# Patient Record
Sex: Male | Born: 1940 | Race: White | Hispanic: No | Marital: Married | Smoking: Never smoker
Health system: Southern US, Community
[De-identification: ages and names within clinical notes are randomized; demographics above are authoritative.]

## PROBLEM LIST (undated history)

## (undated) DIAGNOSIS — E119 Type 2 diabetes mellitus without complications: Secondary | ICD-10-CM

## (undated) DIAGNOSIS — I2510 Atherosclerotic heart disease of native coronary artery without angina pectoris: Secondary | ICD-10-CM

## (undated) DIAGNOSIS — M545 Low back pain, unspecified: Secondary | ICD-10-CM

## (undated) DIAGNOSIS — K5792 Diverticulitis of intestine, part unspecified, without perforation or abscess without bleeding: Secondary | ICD-10-CM

## (undated) DIAGNOSIS — Z860100 Personal history of colon polyps, unspecified: Secondary | ICD-10-CM

## (undated) DIAGNOSIS — I10 Essential (primary) hypertension: Secondary | ICD-10-CM

## (undated) DIAGNOSIS — G4733 Obstructive sleep apnea (adult) (pediatric): Secondary | ICD-10-CM

## (undated) DIAGNOSIS — N400 Enlarged prostate without lower urinary tract symptoms: Secondary | ICD-10-CM

## (undated) DIAGNOSIS — F411 Generalized anxiety disorder: Secondary | ICD-10-CM

## (undated) DIAGNOSIS — E785 Hyperlipidemia, unspecified: Secondary | ICD-10-CM

## (undated) DIAGNOSIS — D126 Benign neoplasm of colon, unspecified: Secondary | ICD-10-CM

## (undated) DIAGNOSIS — K219 Gastro-esophageal reflux disease without esophagitis: Secondary | ICD-10-CM

## (undated) DIAGNOSIS — R799 Abnormal finding of blood chemistry, unspecified: Secondary | ICD-10-CM

## (undated) DIAGNOSIS — I219 Acute myocardial infarction, unspecified: Secondary | ICD-10-CM

## (undated) DIAGNOSIS — N200 Calculus of kidney: Secondary | ICD-10-CM

## (undated) DIAGNOSIS — S9032XA Contusion of left foot, initial encounter: Secondary | ICD-10-CM

## (undated) DIAGNOSIS — S81809A Unspecified open wound, unspecified lower leg, initial encounter: Secondary | ICD-10-CM

## (undated) DIAGNOSIS — K649 Unspecified hemorrhoids: Secondary | ICD-10-CM

## (undated) DIAGNOSIS — IMO0002 Reserved for concepts with insufficient information to code with codable children: Secondary | ICD-10-CM

## (undated) DIAGNOSIS — H269 Unspecified cataract: Secondary | ICD-10-CM

## (undated) DIAGNOSIS — S91009A Unspecified open wound, unspecified ankle, initial encounter: Secondary | ICD-10-CM

## (undated) DIAGNOSIS — S81009A Unspecified open wound, unspecified knee, initial encounter: Secondary | ICD-10-CM

## (undated) DIAGNOSIS — K5730 Diverticulosis of large intestine without perforation or abscess without bleeding: Secondary | ICD-10-CM

## (undated) DIAGNOSIS — T79A29A Traumatic compartment syndrome of unspecified lower extremity, initial encounter: Secondary | ICD-10-CM

## (undated) DIAGNOSIS — Z9989 Dependence on other enabling machines and devices: Secondary | ICD-10-CM

## (undated) DIAGNOSIS — R55 Syncope and collapse: Secondary | ICD-10-CM

## (undated) DIAGNOSIS — E7800 Pure hypercholesterolemia, unspecified: Secondary | ICD-10-CM

## (undated) DIAGNOSIS — N189 Chronic kidney disease, unspecified: Secondary | ICD-10-CM

## (undated) DIAGNOSIS — R109 Unspecified abdominal pain: Secondary | ICD-10-CM

## (undated) DIAGNOSIS — N289 Disorder of kidney and ureter, unspecified: Secondary | ICD-10-CM

## (undated) DIAGNOSIS — N529 Male erectile dysfunction, unspecified: Secondary | ICD-10-CM

## (undated) DIAGNOSIS — K759 Inflammatory liver disease, unspecified: Secondary | ICD-10-CM

## (undated) DIAGNOSIS — I509 Heart failure, unspecified: Secondary | ICD-10-CM

## (undated) HISTORY — DX: Personal history of colonic polyps: Z86.010

## (undated) HISTORY — PX: LITHOTRIPSY: SUR834

## (undated) HISTORY — DX: Acute myocardial infarction, unspecified: I21.9

## (undated) HISTORY — DX: Inflammatory liver disease, unspecified: K75.9

## (undated) HISTORY — DX: Dependence on other enabling machines and devices: Z99.89

## (undated) HISTORY — DX: Essential (primary) hypertension: I10

## (undated) HISTORY — DX: Benign prostatic hyperplasia without lower urinary tract symptoms: N40.0

## (undated) HISTORY — PX: HERNIA REPAIR: SHX51

## (undated) HISTORY — DX: Unspecified abdominal pain: R10.9

## (undated) HISTORY — DX: Low back pain: M54.5

## (undated) HISTORY — DX: Atherosclerotic heart disease of native coronary artery without angina pectoris: I25.10

## (undated) HISTORY — DX: Generalized anxiety disorder: F41.1

## (undated) HISTORY — PX: CORONARY ARTERY BYPASS GRAFT: SHX141

## (undated) HISTORY — DX: Male erectile dysfunction, unspecified: N52.9

## (undated) HISTORY — DX: Unspecified open wound, unspecified knee, initial encounter: S81.009A

## (undated) HISTORY — DX: Unspecified hemorrhoids: K64.9

## (undated) HISTORY — DX: Unspecified open wound, unspecified ankle, initial encounter: S91.009A

## (undated) HISTORY — DX: Gastro-esophageal reflux disease without esophagitis: K21.9

## (undated) HISTORY — PX: ESOPHAGOGASTRODUODENOSCOPY: SHX1529

## (undated) HISTORY — DX: Type 2 diabetes mellitus without complications: E11.9

## (undated) HISTORY — DX: Calculus of kidney: N20.0

## (undated) HISTORY — DX: Heart failure, unspecified: I50.9

## (undated) HISTORY — PX: OTHER SURGICAL HISTORY: SHX169

## (undated) HISTORY — PX: CATARACT EXTRACTION W/ INTRAOCULAR LENS  IMPLANT, BILATERAL: SHX1307

## (undated) HISTORY — DX: Low back pain, unspecified: M54.50

## (undated) HISTORY — DX: Chronic kidney disease, unspecified: N18.9

## (undated) HISTORY — PX: APPENDECTOMY: SHX54

## (undated) HISTORY — DX: Traumatic compartment syndrome of unspecified lower extremity, initial encounter: T79.A29A

## (undated) HISTORY — PX: GALLBLADDER SURGERY: SHX652

## (undated) HISTORY — DX: Obstructive sleep apnea (adult) (pediatric): G47.33

## (undated) HISTORY — DX: Pure hypercholesterolemia, unspecified: E78.00

## (undated) HISTORY — DX: Abnormal finding of blood chemistry, unspecified: R79.9

## (undated) HISTORY — DX: Syncope and collapse: R55

## (undated) HISTORY — PX: CHOLECYSTECTOMY: SHX55

## (undated) HISTORY — DX: Disorder of kidney and ureter, unspecified: N28.9

## (undated) HISTORY — DX: Diverticulitis of intestine, part unspecified, without perforation or abscess without bleeding: K57.92

## (undated) HISTORY — PX: PTCA: SHX146

## (undated) HISTORY — DX: Contusion of left foot, initial encounter: S90.32XA

## (undated) HISTORY — DX: Unspecified cataract: H26.9

## (undated) HISTORY — DX: Unspecified open wound, unspecified lower leg, initial encounter: S81.809A

## (undated) HISTORY — PX: CORONARY ANGIOPLASTY WITH STENT PLACEMENT: SHX49

## (undated) HISTORY — DX: Personal history of colon polyps, unspecified: Z86.0100

## (undated) HISTORY — DX: Reserved for concepts with insufficient information to code with codable children: IMO0002

## (undated) HISTORY — DX: Diverticulosis of large intestine without perforation or abscess without bleeding: K57.30

## (undated) HISTORY — DX: Benign neoplasm of colon, unspecified: D12.6

## (undated) HISTORY — PX: ANKLE SURGERY: SHX546

## (undated) HISTORY — DX: Hyperlipidemia, unspecified: E78.5

---

## 1998-02-04 ENCOUNTER — Emergency Department (HOSPITAL_COMMUNITY): Admission: EM | Admit: 1998-02-04 | Discharge: 1998-02-04 | Payer: Self-pay | Admitting: Emergency Medicine

## 1998-08-04 ENCOUNTER — Inpatient Hospital Stay (HOSPITAL_COMMUNITY): Admission: AD | Admit: 1998-08-04 | Discharge: 1998-08-07 | Payer: Self-pay | Admitting: Cardiology

## 1998-08-04 ENCOUNTER — Encounter: Payer: Self-pay | Admitting: Cardiology

## 1998-08-05 ENCOUNTER — Encounter: Payer: Self-pay | Admitting: Cardiology

## 1999-08-10 ENCOUNTER — Inpatient Hospital Stay (HOSPITAL_COMMUNITY): Admission: RE | Admit: 1999-08-10 | Discharge: 1999-08-11 | Payer: Self-pay | Admitting: Surgery

## 1999-08-10 ENCOUNTER — Encounter: Payer: Self-pay | Admitting: Surgery

## 1999-08-10 ENCOUNTER — Encounter (INDEPENDENT_AMBULATORY_CARE_PROVIDER_SITE_OTHER): Payer: Self-pay | Admitting: *Deleted

## 1999-10-04 ENCOUNTER — Inpatient Hospital Stay (HOSPITAL_COMMUNITY): Admission: EM | Admit: 1999-10-04 | Discharge: 1999-10-09 | Payer: Self-pay | Admitting: Emergency Medicine

## 1999-10-04 ENCOUNTER — Encounter: Payer: Self-pay | Admitting: Cardiology

## 2000-09-19 ENCOUNTER — Encounter: Payer: Self-pay | Admitting: Cardiology

## 2000-09-19 ENCOUNTER — Inpatient Hospital Stay (HOSPITAL_COMMUNITY): Admission: RE | Admit: 2000-09-19 | Discharge: 2000-09-20 | Payer: Self-pay | Admitting: Cardiology

## 2001-02-21 ENCOUNTER — Inpatient Hospital Stay (HOSPITAL_COMMUNITY): Admission: AD | Admit: 2001-02-21 | Discharge: 2001-02-23 | Payer: Self-pay | Admitting: Cardiology

## 2001-02-21 ENCOUNTER — Encounter: Payer: Self-pay | Admitting: Cardiology

## 2001-05-17 ENCOUNTER — Encounter: Payer: Self-pay | Admitting: Gastroenterology

## 2001-05-17 ENCOUNTER — Ambulatory Visit (HOSPITAL_COMMUNITY): Admission: RE | Admit: 2001-05-17 | Discharge: 2001-05-17 | Payer: Self-pay | Admitting: Gastroenterology

## 2001-07-18 ENCOUNTER — Encounter: Payer: Self-pay | Admitting: Emergency Medicine

## 2001-07-18 ENCOUNTER — Inpatient Hospital Stay (HOSPITAL_COMMUNITY): Admission: EM | Admit: 2001-07-18 | Discharge: 2001-07-21 | Payer: Self-pay | Admitting: Emergency Medicine

## 2001-07-19 ENCOUNTER — Encounter: Payer: Self-pay | Admitting: Urology

## 2001-07-20 ENCOUNTER — Encounter: Payer: Self-pay | Admitting: Urology

## 2001-07-23 ENCOUNTER — Ambulatory Visit (HOSPITAL_COMMUNITY): Admission: RE | Admit: 2001-07-23 | Discharge: 2001-07-23 | Payer: Self-pay | Admitting: Urology

## 2001-08-02 ENCOUNTER — Encounter: Payer: Self-pay | Admitting: Urology

## 2001-08-02 ENCOUNTER — Ambulatory Visit (HOSPITAL_BASED_OUTPATIENT_CLINIC_OR_DEPARTMENT_OTHER): Admission: RE | Admit: 2001-08-02 | Discharge: 2001-08-02 | Payer: Self-pay | Admitting: Urology

## 2002-01-09 ENCOUNTER — Encounter: Payer: Self-pay | Admitting: Cardiology

## 2002-01-09 ENCOUNTER — Inpatient Hospital Stay (HOSPITAL_COMMUNITY): Admission: EM | Admit: 2002-01-09 | Discharge: 2002-01-12 | Payer: Self-pay | Admitting: Emergency Medicine

## 2002-05-05 ENCOUNTER — Encounter: Payer: Self-pay | Admitting: Emergency Medicine

## 2002-05-06 ENCOUNTER — Encounter: Payer: Self-pay | Admitting: Emergency Medicine

## 2002-05-06 ENCOUNTER — Inpatient Hospital Stay (HOSPITAL_COMMUNITY): Admission: EM | Admit: 2002-05-06 | Discharge: 2002-05-09 | Payer: Self-pay | Admitting: Emergency Medicine

## 2002-05-28 ENCOUNTER — Ambulatory Visit (HOSPITAL_COMMUNITY): Admission: RE | Admit: 2002-05-28 | Discharge: 2002-05-28 | Payer: Self-pay | Admitting: *Deleted

## 2002-06-17 ENCOUNTER — Encounter (INDEPENDENT_AMBULATORY_CARE_PROVIDER_SITE_OTHER): Payer: Self-pay | Admitting: Specialist

## 2002-06-17 ENCOUNTER — Encounter: Payer: Self-pay | Admitting: Surgery

## 2002-06-17 ENCOUNTER — Inpatient Hospital Stay (HOSPITAL_COMMUNITY): Admission: RE | Admit: 2002-06-17 | Discharge: 2002-06-23 | Payer: Self-pay | Admitting: Surgery

## 2002-06-28 ENCOUNTER — Encounter: Admission: RE | Admit: 2002-06-28 | Discharge: 2002-06-28 | Payer: Self-pay | Admitting: Surgery

## 2002-06-28 ENCOUNTER — Encounter: Payer: Self-pay | Admitting: Surgery

## 2002-10-16 ENCOUNTER — Ambulatory Visit (HOSPITAL_COMMUNITY): Admission: RE | Admit: 2002-10-16 | Discharge: 2002-10-16 | Payer: Self-pay | Admitting: *Deleted

## 2002-10-30 ENCOUNTER — Encounter: Payer: Self-pay | Admitting: Family Medicine

## 2002-10-30 ENCOUNTER — Encounter: Admission: RE | Admit: 2002-10-30 | Discharge: 2002-10-30 | Payer: Self-pay | Admitting: Family Medicine

## 2003-01-23 ENCOUNTER — Ambulatory Visit (HOSPITAL_COMMUNITY): Admission: RE | Admit: 2003-01-23 | Discharge: 2003-01-23 | Payer: Self-pay | Admitting: Surgery

## 2003-01-24 ENCOUNTER — Encounter: Payer: Self-pay | Admitting: Surgery

## 2003-01-24 ENCOUNTER — Ambulatory Visit (HOSPITAL_COMMUNITY): Admission: RE | Admit: 2003-01-24 | Discharge: 2003-01-24 | Payer: Self-pay | Admitting: Surgery

## 2003-03-02 ENCOUNTER — Encounter: Payer: Self-pay | Admitting: Emergency Medicine

## 2003-03-02 ENCOUNTER — Inpatient Hospital Stay (HOSPITAL_COMMUNITY): Admission: EM | Admit: 2003-03-02 | Discharge: 2003-03-04 | Payer: Self-pay | Admitting: Emergency Medicine

## 2003-04-22 ENCOUNTER — Emergency Department (HOSPITAL_COMMUNITY): Admission: EM | Admit: 2003-04-22 | Discharge: 2003-04-23 | Payer: Self-pay | Admitting: *Deleted

## 2003-04-22 ENCOUNTER — Encounter: Payer: Self-pay | Admitting: Cardiology

## 2003-07-26 HISTORY — PX: COLON SURGERY: SHX602

## 2003-09-09 ENCOUNTER — Inpatient Hospital Stay (HOSPITAL_COMMUNITY): Admission: EM | Admit: 2003-09-09 | Discharge: 2003-09-15 | Payer: Self-pay | Admitting: General Surgery

## 2003-10-10 ENCOUNTER — Inpatient Hospital Stay (HOSPITAL_COMMUNITY): Admission: RE | Admit: 2003-10-10 | Discharge: 2003-10-15 | Payer: Self-pay | Admitting: Surgery

## 2003-10-10 ENCOUNTER — Encounter (INDEPENDENT_AMBULATORY_CARE_PROVIDER_SITE_OTHER): Payer: Self-pay | Admitting: *Deleted

## 2004-05-10 ENCOUNTER — Encounter: Admission: RE | Admit: 2004-05-10 | Discharge: 2004-05-10 | Payer: Self-pay | Admitting: Internal Medicine

## 2004-07-25 HISTORY — PX: SMALL INTESTINE SURGERY: SHX150

## 2004-10-20 ENCOUNTER — Ambulatory Visit: Payer: Self-pay | Admitting: Internal Medicine

## 2005-01-26 ENCOUNTER — Encounter (INDEPENDENT_AMBULATORY_CARE_PROVIDER_SITE_OTHER): Payer: Self-pay | Admitting: Specialist

## 2005-01-26 ENCOUNTER — Inpatient Hospital Stay (HOSPITAL_COMMUNITY): Admission: RE | Admit: 2005-01-26 | Discharge: 2005-02-01 | Payer: Self-pay | Admitting: General Surgery

## 2005-02-17 ENCOUNTER — Ambulatory Visit: Payer: Self-pay | Admitting: Internal Medicine

## 2005-03-25 ENCOUNTER — Ambulatory Visit: Payer: Self-pay | Admitting: Internal Medicine

## 2005-05-20 ENCOUNTER — Ambulatory Visit: Payer: Self-pay | Admitting: Internal Medicine

## 2005-06-29 ENCOUNTER — Ambulatory Visit: Payer: Self-pay | Admitting: Internal Medicine

## 2005-07-04 ENCOUNTER — Ambulatory Visit: Payer: Self-pay | Admitting: Internal Medicine

## 2005-07-05 ENCOUNTER — Ambulatory Visit: Payer: Self-pay | Admitting: Internal Medicine

## 2005-07-05 ENCOUNTER — Encounter: Admission: RE | Admit: 2005-07-05 | Discharge: 2005-07-05 | Payer: Self-pay | Admitting: Internal Medicine

## 2005-07-22 ENCOUNTER — Ambulatory Visit: Payer: Self-pay | Admitting: Internal Medicine

## 2005-07-25 DIAGNOSIS — D126 Benign neoplasm of colon, unspecified: Secondary | ICD-10-CM

## 2005-07-25 HISTORY — DX: Benign neoplasm of colon, unspecified: D12.6

## 2005-08-04 ENCOUNTER — Ambulatory Visit: Payer: Self-pay | Admitting: Internal Medicine

## 2005-08-16 ENCOUNTER — Ambulatory Visit: Payer: Self-pay | Admitting: Gastroenterology

## 2005-08-24 ENCOUNTER — Ambulatory Visit: Payer: Self-pay | Admitting: Gastroenterology

## 2005-08-24 ENCOUNTER — Encounter (INDEPENDENT_AMBULATORY_CARE_PROVIDER_SITE_OTHER): Payer: Self-pay | Admitting: Specialist

## 2005-10-21 ENCOUNTER — Ambulatory Visit: Payer: Self-pay | Admitting: Internal Medicine

## 2005-10-21 ENCOUNTER — Inpatient Hospital Stay (HOSPITAL_COMMUNITY): Admission: AD | Admit: 2005-10-21 | Discharge: 2005-10-26 | Payer: Self-pay | Admitting: Internal Medicine

## 2005-10-21 ENCOUNTER — Ambulatory Visit: Payer: Self-pay | Admitting: *Deleted

## 2005-10-25 ENCOUNTER — Encounter: Payer: Self-pay | Admitting: Vascular Surgery

## 2005-10-27 ENCOUNTER — Ambulatory Visit: Payer: Self-pay | Admitting: Cardiology

## 2005-10-31 ENCOUNTER — Ambulatory Visit: Payer: Self-pay | Admitting: Internal Medicine

## 2005-11-09 IMAGING — CT CT PELVIS W/ CM
1 of 4 series · 14 of 32 positions shown, 19 images · IV contrast (omnipaque)
Comparison: 01/24/03.

CLINICAL DATA: Diverticulitis.  
 CT ABDOMEN AND PELVIS WITH CONTRAST ? 09/09/03
 Multidetector helical CT imaging was performed through the abdomen and pelvis following 150 cc of Omnipaque 300 IV.

[Series 2: abd/pelvis 5.0 b30f · axial · 0.84mm/px · z∈[-416,+8]mm · 14 of 97 slices shown, 19 images]
[im 6/97  soft-tissue]
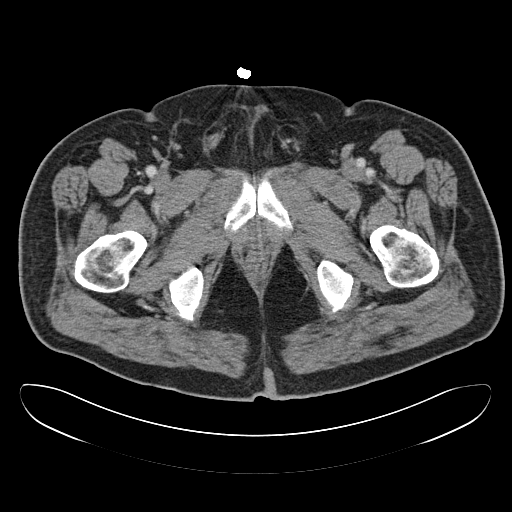
[im 6/97  bone]
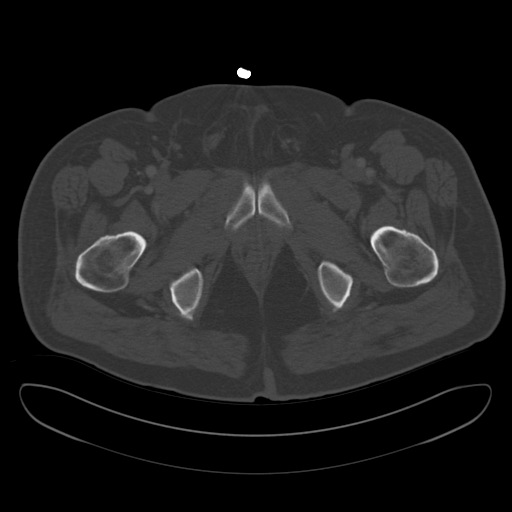
[im 11/97  soft-tissue]
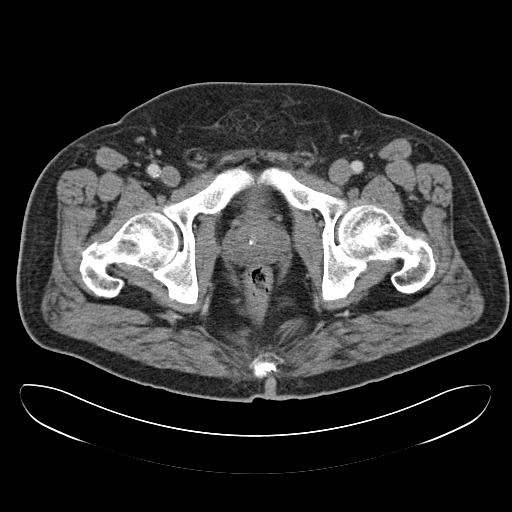
[im 22/97  soft-tissue]
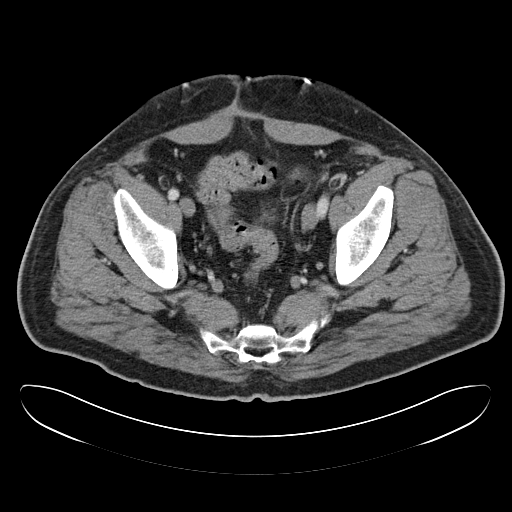
[im 27/97  soft-tissue]
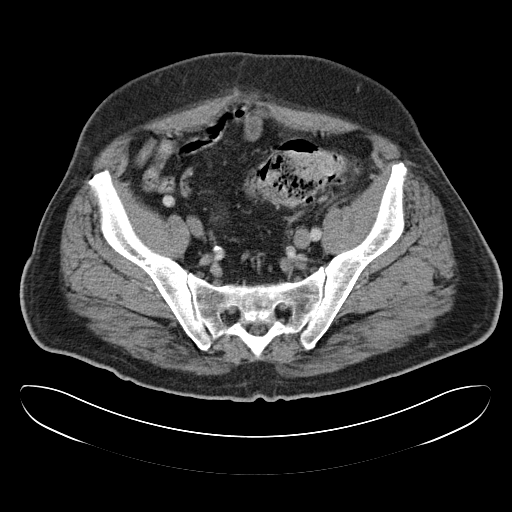
[im 33/97  soft-tissue]
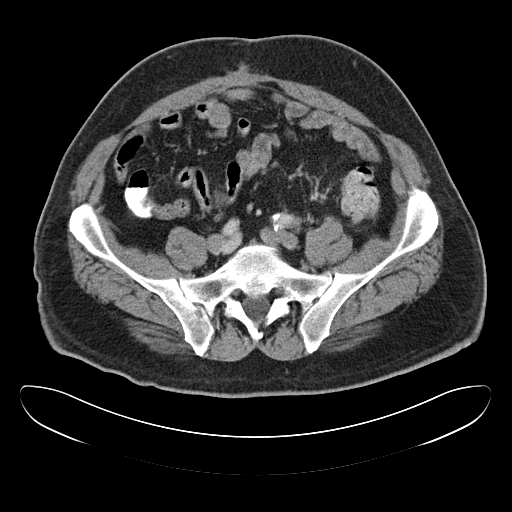
[im 43/97  soft-tissue]
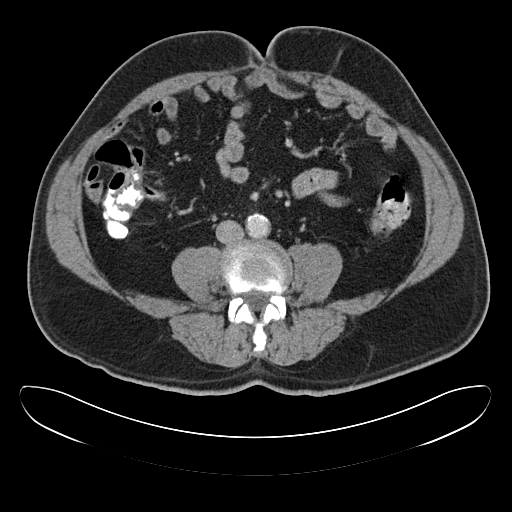
[im 49/97  soft-tissue]
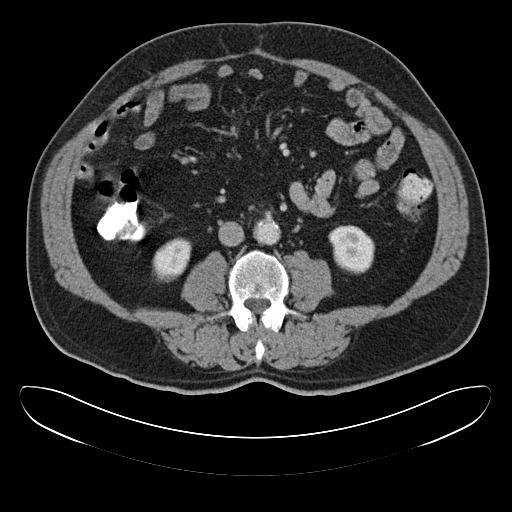
[im 54/97  soft-tissue]
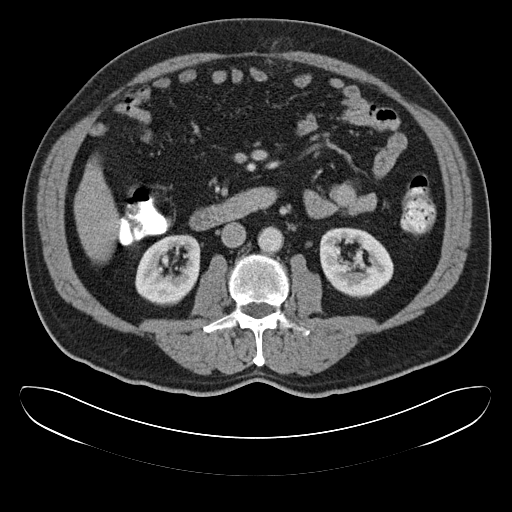
[im 65/97  soft-tissue]
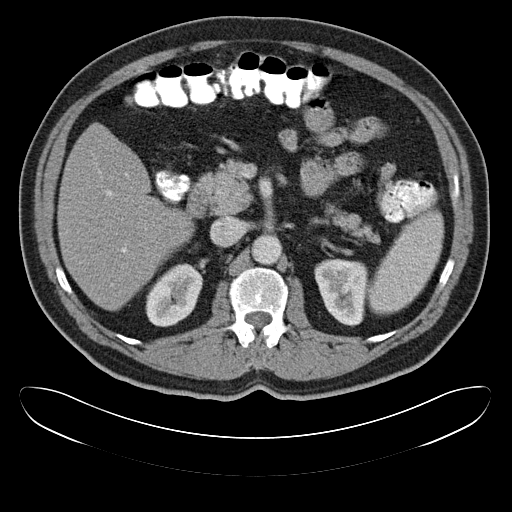
[im 65/97  bone]
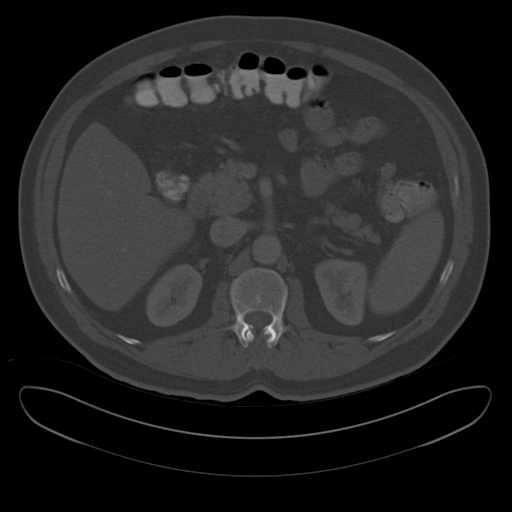
[im 70/97  soft-tissue]
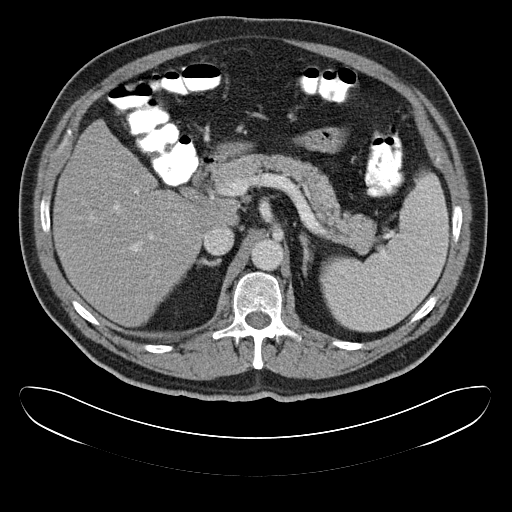
[im 75/97  soft-tissue]
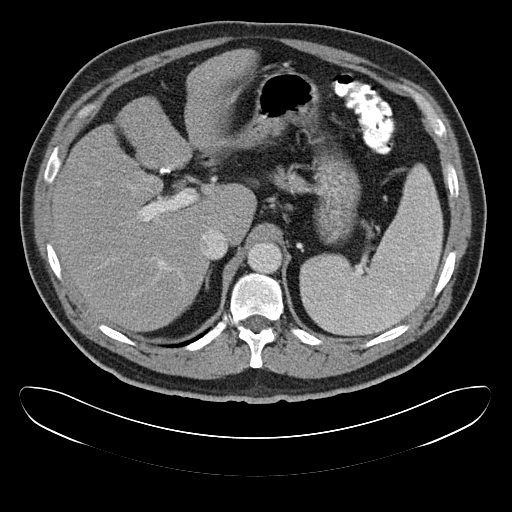
[im 75/97  lung]
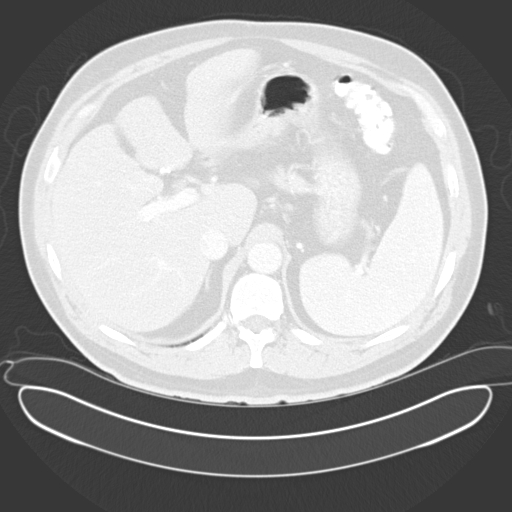
[im 81/97  lung]
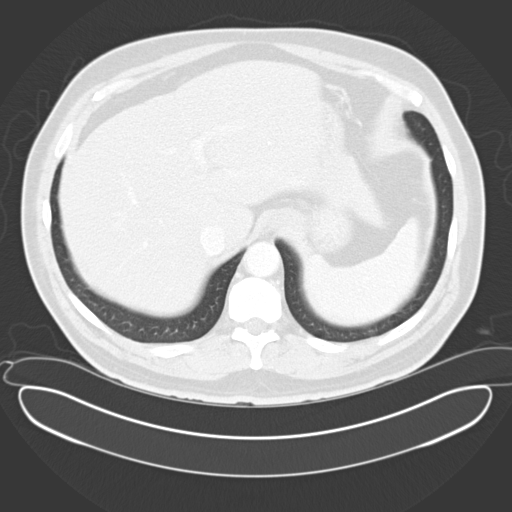
[im 86/97  soft-tissue]
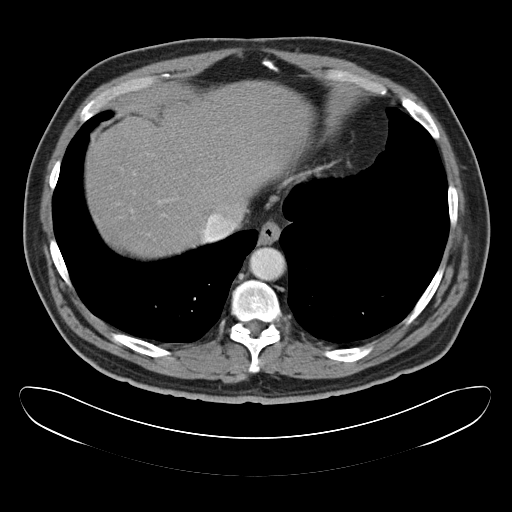
[im 86/97  lung]
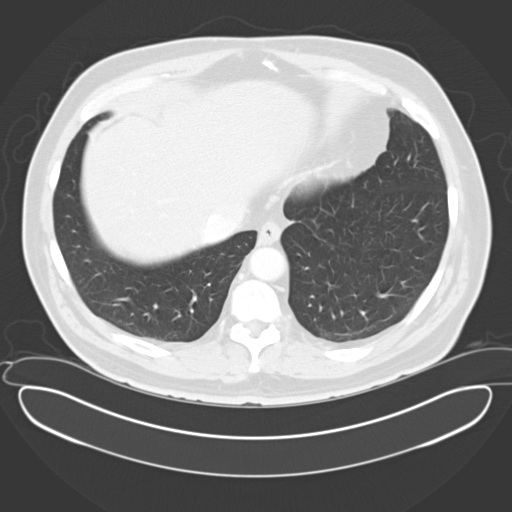
[im 91/97  soft-tissue]
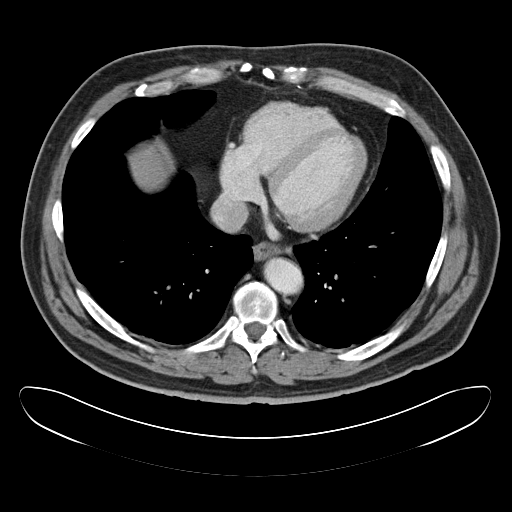
[im 91/97  lung]
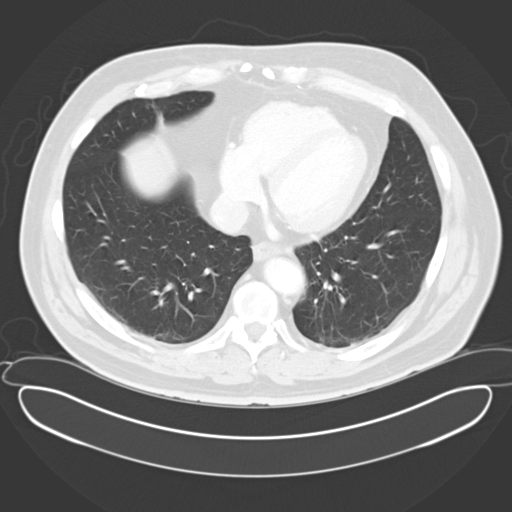

[14 of 32 positions shown; findings below may reference images not displayed]

CT ABDOMEN:
 The patient is status post cholecystectomy.  No focal lesions in the liver, spleen, pancreas, adrenals, or kidneys.  No free fluid, free air, or adenopathy.  The bowel is grossly unremarkable.  There are a few scattered diverticula within the descending colon.  No evidence of diverticulitis.
 IMPRESSION
 Status post cholecystectomy.  Diverticulosis.  No acute disease.
 CT PELVIS:
 Extensive diverticulosis is noted within the sigmoid colon and descending colon.  There is haziness noted around the sigmoid colon compatible with active diverticulitis.  This is new since the prior study.  No free fluid, free air, or adenopathy.  
 IMPRESSION
 Diverticulosis with mild sigmoid diverticulitis.

## 2005-11-11 ENCOUNTER — Ambulatory Visit: Payer: Self-pay | Admitting: Cardiology

## 2005-11-15 ENCOUNTER — Ambulatory Visit: Payer: Self-pay | Admitting: Cardiology

## 2006-02-08 ENCOUNTER — Ambulatory Visit: Payer: Self-pay | Admitting: Internal Medicine

## 2006-04-03 ENCOUNTER — Ambulatory Visit: Payer: Self-pay | Admitting: Internal Medicine

## 2006-04-13 ENCOUNTER — Ambulatory Visit: Payer: Self-pay | Admitting: Internal Medicine

## 2006-04-26 ENCOUNTER — Ambulatory Visit: Payer: Self-pay | Admitting: Internal Medicine

## 2006-05-04 ENCOUNTER — Ambulatory Visit: Payer: Self-pay | Admitting: Gastroenterology

## 2006-05-19 ENCOUNTER — Ambulatory Visit: Payer: Self-pay | Admitting: Internal Medicine

## 2006-05-19 LAB — CONVERTED CEMR LAB
ALT: 20 units/L (ref 0–40)
AST: 24 units/L (ref 0–37)
LDL Cholesterol: 101 mg/dL — ABNORMAL HIGH (ref 0–99)
VLDL: 18 mg/dL (ref 0–40)

## 2006-05-29 ENCOUNTER — Ambulatory Visit: Payer: Self-pay | Admitting: Internal Medicine

## 2006-08-30 DIAGNOSIS — D126 Benign neoplasm of colon, unspecified: Secondary | ICD-10-CM

## 2006-08-30 DIAGNOSIS — K589 Irritable bowel syndrome without diarrhea: Secondary | ICD-10-CM | POA: Insufficient documentation

## 2006-08-30 DIAGNOSIS — K5730 Diverticulosis of large intestine without perforation or abscess without bleeding: Secondary | ICD-10-CM | POA: Insufficient documentation

## 2007-03-21 ENCOUNTER — Ambulatory Visit: Payer: Self-pay | Admitting: Internal Medicine

## 2007-03-26 LAB — CONVERTED CEMR LAB
Hgb A1c MFr Bld: 5.6 % (ref 4.6–6.0)
Microalb Creat Ratio: 13.9 mg/g (ref 0.0–30.0)

## 2007-03-27 ENCOUNTER — Encounter (INDEPENDENT_AMBULATORY_CARE_PROVIDER_SITE_OTHER): Payer: Self-pay | Admitting: *Deleted

## 2007-04-02 ENCOUNTER — Ambulatory Visit: Payer: Self-pay | Admitting: Internal Medicine

## 2007-04-02 DIAGNOSIS — M545 Low back pain: Secondary | ICD-10-CM

## 2007-04-02 DIAGNOSIS — R109 Unspecified abdominal pain: Secondary | ICD-10-CM | POA: Insufficient documentation

## 2007-04-02 DIAGNOSIS — R7989 Other specified abnormal findings of blood chemistry: Secondary | ICD-10-CM | POA: Insufficient documentation

## 2007-04-02 DIAGNOSIS — R1013 Epigastric pain: Secondary | ICD-10-CM

## 2007-04-10 ENCOUNTER — Ambulatory Visit: Payer: Self-pay | Admitting: Internal Medicine

## 2007-04-11 ENCOUNTER — Ambulatory Visit: Payer: Self-pay | Admitting: Internal Medicine

## 2007-04-13 ENCOUNTER — Encounter (INDEPENDENT_AMBULATORY_CARE_PROVIDER_SITE_OTHER): Payer: Self-pay | Admitting: *Deleted

## 2007-04-13 ENCOUNTER — Ambulatory Visit: Payer: Self-pay | Admitting: Internal Medicine

## 2007-04-13 LAB — CONVERTED CEMR LAB
ALT: 20 units/L (ref 0–53)
AST: 21 units/L (ref 0–37)
Albumin: 4 g/dL (ref 3.5–5.2)
Amylase: 44 units/L (ref 27–131)
Basophils Relative: 0.2 % (ref 0.0–1.0)
HCT: 38.5 % — ABNORMAL LOW (ref 39.0–52.0)
Hemoglobin: 13.7 g/dL (ref 13.0–17.0)
Monocytes Absolute: 0.6 10*3/uL (ref 0.2–0.7)
Monocytes Relative: 7.8 % (ref 3.0–11.0)
Neutrophils Relative %: 64.6 % (ref 43.0–77.0)
RDW: 13 % (ref 11.5–14.6)
Total Bilirubin: 0.9 mg/dL (ref 0.3–1.2)

## 2007-05-30 ENCOUNTER — Inpatient Hospital Stay (HOSPITAL_COMMUNITY): Admission: EM | Admit: 2007-05-30 | Discharge: 2007-06-01 | Payer: Self-pay | Admitting: Emergency Medicine

## 2007-06-13 ENCOUNTER — Ambulatory Visit: Payer: Self-pay | Admitting: Internal Medicine

## 2007-07-10 ENCOUNTER — Encounter: Payer: Self-pay | Admitting: Internal Medicine

## 2007-07-25 ENCOUNTER — Telehealth (INDEPENDENT_AMBULATORY_CARE_PROVIDER_SITE_OTHER): Payer: Self-pay | Admitting: *Deleted

## 2007-07-27 ENCOUNTER — Telehealth (INDEPENDENT_AMBULATORY_CARE_PROVIDER_SITE_OTHER): Payer: Self-pay | Admitting: *Deleted

## 2007-07-27 ENCOUNTER — Encounter: Payer: Self-pay | Admitting: Internal Medicine

## 2007-07-30 ENCOUNTER — Ambulatory Visit: Payer: Self-pay | Admitting: Internal Medicine

## 2007-07-30 DIAGNOSIS — T887XXA Unspecified adverse effect of drug or medicament, initial encounter: Secondary | ICD-10-CM | POA: Insufficient documentation

## 2007-07-30 LAB — CONVERTED CEMR LAB
ALT: 27 units/L (ref 0–53)
AST: 29 units/L (ref 0–37)
BUN: 27 mg/dL — ABNORMAL HIGH (ref 6–23)
Microalb Creat Ratio: 8.2 mg/g (ref 0.0–30.0)

## 2007-08-02 ENCOUNTER — Ambulatory Visit: Payer: Self-pay | Admitting: Internal Medicine

## 2007-08-02 DIAGNOSIS — E782 Mixed hyperlipidemia: Secondary | ICD-10-CM

## 2007-08-02 DIAGNOSIS — N189 Chronic kidney disease, unspecified: Secondary | ICD-10-CM

## 2007-08-02 DIAGNOSIS — I2510 Atherosclerotic heart disease of native coronary artery without angina pectoris: Secondary | ICD-10-CM

## 2007-08-02 DIAGNOSIS — R55 Syncope and collapse: Secondary | ICD-10-CM

## 2007-08-02 DIAGNOSIS — I10 Essential (primary) hypertension: Secondary | ICD-10-CM

## 2007-08-02 LAB — CONVERTED CEMR LAB: Cholesterol, target level: 200 mg/dL

## 2007-08-03 ENCOUNTER — Telehealth (INDEPENDENT_AMBULATORY_CARE_PROVIDER_SITE_OTHER): Payer: Self-pay | Admitting: *Deleted

## 2007-08-07 ENCOUNTER — Encounter: Payer: Self-pay | Admitting: Internal Medicine

## 2007-10-31 ENCOUNTER — Telehealth (INDEPENDENT_AMBULATORY_CARE_PROVIDER_SITE_OTHER): Payer: Self-pay | Admitting: *Deleted

## 2007-11-01 ENCOUNTER — Ambulatory Visit: Payer: Self-pay | Admitting: Internal Medicine

## 2007-11-01 DIAGNOSIS — S139XXA Sprain of joints and ligaments of unspecified parts of neck, initial encounter: Secondary | ICD-10-CM

## 2007-11-01 DIAGNOSIS — S9030XA Contusion of unspecified foot, initial encounter: Secondary | ICD-10-CM | POA: Insufficient documentation

## 2007-12-03 ENCOUNTER — Ambulatory Visit: Payer: Self-pay | Admitting: Internal Medicine

## 2007-12-03 LAB — CONVERTED CEMR LAB
AST: 20 units/L (ref 0–37)
BUN: 37 mg/dL — ABNORMAL HIGH (ref 6–23)
Creatinine, Ser: 1.6 mg/dL — ABNORMAL HIGH (ref 0.4–1.5)
Direct LDL: 205.6 mg/dL
HDL: 29.4 mg/dL — ABNORMAL LOW (ref >39.0)
Hgb A1c MFr Bld: 5.8 % (ref 4.6–6.0)
Potassium: 4 meq/L (ref 3.5–5.1)
Total Bilirubin: 0.9 mg/dL (ref 0.3–1.2)
Total CHOL/HDL Ratio: 8.7
Triglycerides: 166 mg/dL — ABNORMAL HIGH (ref 0–149)
VLDL: 33 mg/dL (ref 0–40)

## 2007-12-07 ENCOUNTER — Ambulatory Visit: Payer: Self-pay | Admitting: Internal Medicine

## 2007-12-07 DIAGNOSIS — E785 Hyperlipidemia, unspecified: Secondary | ICD-10-CM

## 2007-12-07 DIAGNOSIS — M542 Cervicalgia: Secondary | ICD-10-CM | POA: Insufficient documentation

## 2008-01-14 ENCOUNTER — Ambulatory Visit: Payer: Self-pay | Admitting: Internal Medicine

## 2008-01-14 DIAGNOSIS — M79609 Pain in unspecified limb: Secondary | ICD-10-CM | POA: Insufficient documentation

## 2008-01-14 DIAGNOSIS — D179 Benign lipomatous neoplasm, unspecified: Secondary | ICD-10-CM | POA: Insufficient documentation

## 2008-01-24 ENCOUNTER — Encounter: Payer: Self-pay | Admitting: Internal Medicine

## 2008-01-28 ENCOUNTER — Encounter: Payer: Self-pay | Admitting: Internal Medicine

## 2008-02-13 ENCOUNTER — Encounter (INDEPENDENT_AMBULATORY_CARE_PROVIDER_SITE_OTHER): Payer: Self-pay | Admitting: *Deleted

## 2008-03-26 ENCOUNTER — Ambulatory Visit: Payer: Self-pay | Admitting: Internal Medicine

## 2008-03-26 ENCOUNTER — Telehealth (INDEPENDENT_AMBULATORY_CARE_PROVIDER_SITE_OTHER): Payer: Self-pay | Admitting: *Deleted

## 2008-03-26 DIAGNOSIS — R7309 Other abnormal glucose: Secondary | ICD-10-CM

## 2008-03-26 DIAGNOSIS — R51 Headache: Secondary | ICD-10-CM

## 2008-03-26 DIAGNOSIS — R519 Headache, unspecified: Secondary | ICD-10-CM | POA: Insufficient documentation

## 2008-05-05 ENCOUNTER — Telehealth (INDEPENDENT_AMBULATORY_CARE_PROVIDER_SITE_OTHER): Payer: Self-pay | Admitting: *Deleted

## 2008-06-11 ENCOUNTER — Ambulatory Visit: Payer: Self-pay | Admitting: Internal Medicine

## 2008-07-09 ENCOUNTER — Ambulatory Visit: Payer: Self-pay | Admitting: Internal Medicine

## 2008-07-28 ENCOUNTER — Ambulatory Visit: Payer: Self-pay | Admitting: Internal Medicine

## 2008-08-03 LAB — CONVERTED CEMR LAB: Hgb A1c MFr Bld: 6 % (ref 4.6–6.0)

## 2008-08-04 ENCOUNTER — Encounter (INDEPENDENT_AMBULATORY_CARE_PROVIDER_SITE_OTHER): Payer: Self-pay | Admitting: *Deleted

## 2008-09-01 ENCOUNTER — Encounter: Payer: Self-pay | Admitting: Internal Medicine

## 2008-10-30 ENCOUNTER — Ambulatory Visit: Payer: Self-pay | Admitting: Internal Medicine

## 2008-10-30 DIAGNOSIS — R109 Unspecified abdominal pain: Secondary | ICD-10-CM

## 2008-10-30 DIAGNOSIS — Z87442 Personal history of urinary calculi: Secondary | ICD-10-CM | POA: Insufficient documentation

## 2008-10-30 LAB — CONVERTED CEMR LAB
Bilirubin Urine: NEGATIVE
Blood in Urine, dipstick: NEGATIVE
Glucose, Urine, Semiquant: NEGATIVE
Protein, U semiquant: NEGATIVE
Specific Gravity, Urine: 1.015
pH: 6

## 2008-11-03 ENCOUNTER — Telehealth: Payer: Self-pay | Admitting: Internal Medicine

## 2009-01-09 ENCOUNTER — Encounter: Payer: Self-pay | Admitting: Internal Medicine

## 2009-01-28 ENCOUNTER — Ambulatory Visit: Payer: Self-pay | Admitting: Internal Medicine

## 2009-01-31 LAB — CONVERTED CEMR LAB: Hgb A1c MFr Bld: 6.1 % (ref 4.6–6.5)

## 2009-02-03 ENCOUNTER — Encounter (INDEPENDENT_AMBULATORY_CARE_PROVIDER_SITE_OTHER): Payer: Self-pay | Admitting: *Deleted

## 2009-03-17 ENCOUNTER — Encounter: Payer: Self-pay | Admitting: Internal Medicine

## 2009-03-19 ENCOUNTER — Ambulatory Visit: Payer: Self-pay | Admitting: Internal Medicine

## 2009-05-06 ENCOUNTER — Ambulatory Visit: Payer: Self-pay | Admitting: Internal Medicine

## 2009-05-13 ENCOUNTER — Encounter (INDEPENDENT_AMBULATORY_CARE_PROVIDER_SITE_OTHER): Payer: Self-pay | Admitting: *Deleted

## 2009-05-26 ENCOUNTER — Ambulatory Visit: Payer: Self-pay | Admitting: Internal Medicine

## 2009-06-01 ENCOUNTER — Encounter (INDEPENDENT_AMBULATORY_CARE_PROVIDER_SITE_OTHER): Payer: Self-pay | Admitting: *Deleted

## 2009-06-01 LAB — CONVERTED CEMR LAB
ALT: 24 units/L (ref 0–53)
AST: 25 units/L (ref 0–37)
Albumin: 4.4 g/dL (ref 3.5–5.2)
Alkaline Phosphatase: 42 units/L (ref 39–117)
BUN: 40 mg/dL — ABNORMAL HIGH (ref 6–23)
Cholesterol: 180 mg/dL (ref 0–200)
Direct LDL: 127.3 mg/dL
Hgb A1c MFr Bld: 5.9 % (ref 4.6–6.5)
Microalb Creat Ratio: 4.6 mg/g (ref 0.0–30.0)
Microalb, Ur: 1.5 mg/dL (ref 0.0–1.9)
Total Bilirubin: 1 mg/dL (ref 0.3–1.2)
Triglycerides: 220 mg/dL — ABNORMAL HIGH (ref 0.0–149.0)

## 2009-06-05 ENCOUNTER — Ambulatory Visit: Payer: Self-pay | Admitting: Internal Medicine

## 2009-06-05 DIAGNOSIS — N259 Disorder resulting from impaired renal tubular function, unspecified: Secondary | ICD-10-CM | POA: Insufficient documentation

## 2009-07-07 ENCOUNTER — Ambulatory Visit: Payer: Self-pay | Admitting: Internal Medicine

## 2009-07-13 LAB — CONVERTED CEMR LAB
Creatinine, Ser: 1.8 mg/dL — ABNORMAL HIGH (ref 0.4–1.5)
Uric Acid, Serum: 5.6 mg/dL (ref 4.0–7.8)

## 2009-07-14 ENCOUNTER — Ambulatory Visit: Payer: Self-pay | Admitting: Internal Medicine

## 2009-07-14 DIAGNOSIS — R1319 Other dysphagia: Secondary | ICD-10-CM | POA: Insufficient documentation

## 2009-07-14 DIAGNOSIS — R079 Chest pain, unspecified: Secondary | ICD-10-CM | POA: Insufficient documentation

## 2009-10-23 ENCOUNTER — Encounter: Payer: Self-pay | Admitting: Internal Medicine

## 2009-11-04 ENCOUNTER — Encounter: Payer: Self-pay | Admitting: Internal Medicine

## 2009-11-18 ENCOUNTER — Ambulatory Visit: Payer: Self-pay | Admitting: Internal Medicine

## 2009-11-18 DIAGNOSIS — M25519 Pain in unspecified shoulder: Secondary | ICD-10-CM | POA: Insufficient documentation

## 2009-11-18 DIAGNOSIS — IMO0001 Reserved for inherently not codable concepts without codable children: Secondary | ICD-10-CM

## 2009-11-18 DIAGNOSIS — F411 Generalized anxiety disorder: Secondary | ICD-10-CM | POA: Insufficient documentation

## 2009-11-19 ENCOUNTER — Telehealth (INDEPENDENT_AMBULATORY_CARE_PROVIDER_SITE_OTHER): Payer: Self-pay | Admitting: *Deleted

## 2009-11-19 LAB — CONVERTED CEMR LAB
Bilirubin, Direct: 0 mg/dL (ref 0.0–0.3)
Calcium: 9.2 mg/dL (ref 8.4–10.5)
Creatinine, Ser: 2 mg/dL — ABNORMAL HIGH (ref 0.4–1.5)
GFR calc non Af Amer: 35.56 mL/min (ref >60)
Glucose, Bld: 140 mg/dL — ABNORMAL HIGH (ref 70–99)
HDL: 40 mg/dL (ref >39.00)
Phosphorus: 4.1 mg/dL (ref 2.3–4.6)
Potassium: 4.3 meq/L (ref 3.5–5.1)
Sodium: 143 meq/L (ref 135–145)
Total Bilirubin: 0.8 mg/dL (ref 0.3–1.2)
Total CHOL/HDL Ratio: 5
VLDL: 32.2 mg/dL (ref 0.0–40.0)

## 2009-11-27 ENCOUNTER — Telehealth (INDEPENDENT_AMBULATORY_CARE_PROVIDER_SITE_OTHER): Payer: Self-pay | Admitting: *Deleted

## 2009-11-30 ENCOUNTER — Ambulatory Visit: Payer: Self-pay | Admitting: Internal Medicine

## 2009-12-30 ENCOUNTER — Encounter: Payer: Self-pay | Admitting: Internal Medicine

## 2010-01-26 ENCOUNTER — Telehealth (INDEPENDENT_AMBULATORY_CARE_PROVIDER_SITE_OTHER): Payer: Self-pay | Admitting: *Deleted

## 2010-02-03 ENCOUNTER — Ambulatory Visit: Payer: Self-pay | Admitting: Internal Medicine

## 2010-02-11 LAB — CONVERTED CEMR LAB
Alkaline Phosphatase: 47 units/L (ref 39–117)
BUN: 28 mg/dL — ABNORMAL HIGH (ref 6–23)
Bilirubin, Direct: 0.1 mg/dL (ref 0.0–0.3)
Cholesterol: 181 mg/dL (ref 0–200)
Creatinine, Ser: 1.5 mg/dL (ref 0.4–1.5)
HDL: 40.5 mg/dL (ref >39.00)
LDL Cholesterol: 107 mg/dL — ABNORMAL HIGH (ref 0–99)
Potassium: 4.2 meq/L (ref 3.5–5.1)
Total Bilirubin: 0.7 mg/dL (ref 0.3–1.2)
Total CHOL/HDL Ratio: 4
Total Protein: 7.4 g/dL (ref 6.0–8.3)
Triglycerides: 170 mg/dL — ABNORMAL HIGH (ref 0.0–149.0)
VLDL: 34 mg/dL (ref 0.0–40.0)

## 2010-02-16 ENCOUNTER — Ambulatory Visit: Payer: Self-pay | Admitting: Internal Medicine

## 2010-03-30 ENCOUNTER — Encounter: Payer: Self-pay | Admitting: Internal Medicine

## 2010-03-31 ENCOUNTER — Encounter: Payer: Self-pay | Admitting: Internal Medicine

## 2010-04-22 ENCOUNTER — Ambulatory Visit: Payer: Self-pay | Admitting: Internal Medicine

## 2010-04-22 DIAGNOSIS — M940 Chondrocostal junction syndrome [Tietze]: Secondary | ICD-10-CM

## 2010-05-18 ENCOUNTER — Encounter: Payer: Self-pay | Admitting: Internal Medicine

## 2010-07-01 ENCOUNTER — Ambulatory Visit: Payer: Self-pay | Admitting: Internal Medicine

## 2010-07-25 HISTORY — PX: INCISIONAL HERNIA REPAIR: SHX193

## 2010-07-29 ENCOUNTER — Telehealth: Payer: Self-pay | Admitting: Internal Medicine

## 2010-08-04 ENCOUNTER — Encounter: Payer: Self-pay | Admitting: Gastroenterology

## 2010-08-16 ENCOUNTER — Ambulatory Visit
Admission: RE | Admit: 2010-08-16 | Discharge: 2010-08-16 | Payer: Self-pay | Source: Home / Self Care | Attending: Internal Medicine | Admitting: Internal Medicine

## 2010-08-16 DIAGNOSIS — M7580 Other shoulder lesions, unspecified shoulder: Secondary | ICD-10-CM

## 2010-08-17 ENCOUNTER — Ambulatory Visit
Admission: RE | Admit: 2010-08-17 | Discharge: 2010-08-17 | Payer: Self-pay | Source: Home / Self Care | Attending: Internal Medicine | Admitting: Internal Medicine

## 2010-08-24 NOTE — Assessment & Plan Note (Signed)
Summary: POSSIBLE FLU/KB   Vital Signs:  Patient profile:   70 year old male Weight:      251.8 pounds BMI:     36.78 O2 Sat:      95 % on Room air Temp:     98.4 degrees F oral Pulse rate:   64 / minute Resp:     15 per minute BP sitting:   110 / 72  (left arm) Cuff size:   large  Vitals Entered By: Shonna Chock CMA (July 01, 2010 10:17 AM)  O2 Flow:  Room air CC: Cough, nausea, sweats and achy x 1 week-? Flu , URI symptoms   CC:  Cough, nausea, sweats and achy x 1 week-? Flu , and URI symptoms.  History of Present Illness:      This is a 70 year old man who presents with RTI symptoms; onset as ST & dry cough.  The patient  now reports clear nasal discharge, sore throat, and productive cough with scant yellow, but denies earache.  The patient denies fever ( but some sweating), dyspnea, and wheezing.  The patient denies headache.  The patient denies the following risk factors for Strep sinusitis: unilateral facial pain, tooth pain, and tender adenopathy.  Rx: Robitussin , Tylenol, Ricola. He had Flu shot.  Current Medications (verified): 1)  Imdur 30 Mg Xr24h-Tab (Isosorbide Mononitrate) .Marland Kitchen.. 1 By Mouth Two Times A Day 2)  Paroxetine Hcl 30 Mg  Tabs (Paroxetine Hcl) .Marland Kitchen.. 1 Two Times A Day 3)  Losartan Potassium 100 Mg Tabs (Losartan Potassium) .Marland Kitchen.. 1 By Mouth Once Daily 4)  Fiber .... 2 Tabs Daily 5)  Vit C 1000mg  6)  Aspirin Adult Low Strength 81 Mg Tbec (Aspirin) .Marland Kitchen.. 1 By Mouth Once Daily 7)  Nitro-Dur 0.4 Mg/hr Pt24 (Nitroglycerin) .... As Needed 8)  Carvedilol 25 Mg Tabs (Carvedilol) .Marland Kitchen.. 1 By Mouth Two Times A Day 9)  Effient 10 Mg Tabs (Prasugrel Hcl) .Marland Kitchen.. 1 By Mouth Once Daily 10)  Prilosec Otc 20 Mg Tbec (Omeprazole Magnesium) .Marland Kitchen.. 1 By Mouth  Two Times A Day Pre Meals 11)  Ranexa 500 Mg Xr12h-Tab (Ranolazine) .Marland Kitchen.. 1 By Mouth Two Times A Day 12)  Trilipix 135 Mg Cpdr (Choline Fenofibrate) .Marland Kitchen.. 1 Each Am 13)  Onetouch Ultra Test  Strp (Glucose Blood) .... Onetouch  Ultra2 Test Strips Dx: 790.29, Check Bloodsugar Once Daily 14)  Crestor 40 Mg Tabs (Rosuvastatin Calcium) .Marland Kitchen.. 1 By Mouth At Bedtime  Allergies: 1)  ! Lipitor (Atorvastatin) 2)  ! Zocor 3)  ! Crestor (Rosuvastatin Calcium)  Physical Exam  General:  Appears tired but in no acute distress; alert,appropriate and cooperative throughout examination Ears:  External ear exam shows no significant lesions or deformities.  Otoscopic examination reveals clear canals, tympanic membranes are intact bilaterally without bulging, retraction, inflammation or discharge. Hearing is grossly normal bilaterally. Nose:  External nasal examination shows no deformity or inflammation. Nasal mucosa are pink and moist without lesions or exudates. Mouth:  Oral mucosa and oropharynx without lesions or exudates.  Teeth in good repair. Hoarse Lungs:  Normal respiratory effort, chest expands symmetrically. Lungs : minimal rhonchi Cervical Nodes:  No lymphadenopathy noted Axillary Nodes:  No palpable lymphadenopathy   Impression & Recommendations:  Problem # 1:  BRONCHITIS-ACUTE (ICD-466.0)  His updated medication list for this problem includes:    Azithromycin 250 Mg Tabs (Azithromycin) .Marland Kitchen... As per pack    Hydromet 5-1.5 Mg/83ml Syrp (Hydrocodone-homatropine) .Marland Kitchen... 1 tsp every 6 hrs as  needed for cough  Problem # 2:  URI (ICD-465.9)  His updated medication list for this problem includes:    Aspirin Adult Low Strength 81 Mg Tbec (Aspirin) .Marland Kitchen... 1 by mouth once daily    Hydromet 5-1.5 Mg/25ml Syrp (Hydrocodone-homatropine) .Marland Kitchen... 1 tsp every 6 hrs as needed for cough  Complete Medication List: 1)  Imdur 30 Mg Xr24h-tab (Isosorbide mononitrate) .Marland Kitchen.. 1 by mouth two times a day 2)  Paroxetine Hcl 30 Mg Tabs (Paroxetine hcl) .Marland Kitchen.. 1 two times a day 3)  Losartan Potassium 100 Mg Tabs (Losartan potassium) .Marland Kitchen.. 1 by mouth once daily 4)  Fiber  .... 2 tabs daily 5)  Vit C 1000mg   6)  Aspirin Adult Low Strength 81 Mg Tbec  (Aspirin) .Marland Kitchen.. 1 by mouth once daily 7)  Nitro-dur 0.4 Mg/hr Pt24 (Nitroglycerin) .... As needed 8)  Carvedilol 25 Mg Tabs (Carvedilol) .Marland Kitchen.. 1 by mouth two times a day 9)  Effient 10 Mg Tabs (Prasugrel hcl) .Marland Kitchen.. 1 by mouth once daily 10)  Prilosec Otc 20 Mg Tbec (Omeprazole magnesium) .Marland Kitchen.. 1 by mouth  two times a day pre meals 11)  Ranexa 500 Mg Xr12h-tab (Ranolazine) .Marland Kitchen.. 1 by mouth two times a day 12)  Trilipix 135 Mg Cpdr (Choline fenofibrate) .Marland Kitchen.. 1 each am 13)  Onetouch Ultra Test Strp (Glucose blood) .... Onetouch ultra2 test strips dx: 790.29, check bloodsugar once daily 14)  Crestor 40 Mg Tabs (Rosuvastatin calcium) .Marland Kitchen.. 1 by mouth at bedtime 15)  Azithromycin 250 Mg Tabs (Azithromycin) .... As per pack 16)  Hydromet 5-1.5 Mg/16ml Syrp (Hydrocodone-homatropine) .Marland Kitchen.. 1 tsp every 6 hrs as needed for cough  Other Orders: Flu A+B (81191)  Patient Instructions: 1)  Drink as much NON dairy  fluid as you can tolerate for the next few days. Prescriptions: HYDROMET 5-1.5 MG/5ML SYRP (HYDROCODONE-HOMATROPINE) 1 tsp every 6 hrs as needed for cough  #120cc x 0   Entered and Authorized by:   Marga Melnick MD   Signed by:   Marga Melnick MD on 07/01/2010   Method used:   Printed then faxed to ...       [facility] [address] [phone]* (retail)       [address]       [place], [place]  [address]       Ph: [phone] or [phone]       Fax: [phone]   RxID:   [id number] AZITHROMYCIN 250 MG TABS (AZITHROMYCIN) as per pack  #1 x 0   Entered and Authorized by:   Marga Melnick MD   Signed by:   Marga Melnick MD on 07/01/2010   Method used:   Printed then faxed to ...       [facility] [address] [phone]* (retail)       [address]       [place], [place]  [address]       Ph: [phone] or [phone]       Fax: [phone]   RxID:   [id number]    Orders Added: 1)  Flu A+B [87400] 2)  Est. Patient Level III  [16073]

## 2010-08-24 NOTE — Letter (Signed)
Summary: Results Follow up Letter  [place] at [facility]  [address] [place], [place] [address]   Phone: [phone]  Fax: [phone]    03/27/2007 MRN: [id number]  Bryan [name] [address] [place], [place]  [address]  Dear [name],  The following are the results of your recent test(s):  Test         Result    Pap Smear:        Normal _____  Not Normal _____ Comments: ______________________________________________________ Cholesterol: LDL(Bad cholesterol):         Your goal is less than:         HDL (Good cholesterol):       Your goal is more than: Comments:  ______________________________________________________ Mammogram:        Normal _____  Not Normal _____ Comments:  ___________________________________________________________________ Hemoccult:        Normal _____  Not normal _______ Comments:    _____________________________________________________________________ Other Tests:  Please see attached results and comments   We routinely do not discuss normal results over the telephone.  If you desire a copy of the results, or you have any questions about this information we can discuss them at your next office visit.   Sincerely,

## 2010-08-24 NOTE — Assessment & Plan Note (Signed)
Summary: flu shot//ph   Nurse Visit    Prior Medications: FENOFIBRATE 160 MG  TABS (FENOFIBRATE) 1 once daily PLAVIX 75 MG  TABS (CLOPIDOGREL BISULFATE) TAKE ONE TABLET DAILY ATENOLOL 25 MG  TABS (ATENOLOL) TAKE ONE TABLET DAILY IMDUR 30 MG  TB24 (ISOSORBIDE MONONITRATE) TAKE ONE TABLET DAILY PAROXETINE HCL 30 MG  TABS (PAROXETINE HCL) 1/2 by mouth in the morning, 1 by mouth in the evening SG ENTERIC COATED ASPIRIN 325 MG  TBEC (ASPIRIN) TAKE ONE DAILY PRILOSEC 20 MG  CPDR (OMEPRAZOLE) TAKE ONE TABLET DAILY BENICAR 20 MG  TABS (OLMESARTAN MEDOXOMIL) 1 by mouth once daily FIBER () 2 tabs daily VIT C 1000MG  ()  NIASPAN 500 MG CR-TABS (NIACIN (ANTIHYPERLIPIDEMIC)) 1 by mouth two times a day GABARONE 100 MG TABS (GABAPENTIN) 1/2 -1 q 8 hrs as needed headache Current Allergies: No known allergies   Flu Vaccine Consent Questions     Do you have a history of severe allergic reactions to this vaccine? no    Any prior history of allergic reactions to egg and/or gelatin? no    Do you have a sensitivity to the preservative Thimersol? no    Do you have a past history of Guillan-Barre Syndrome? no    Do you currently have an acute febrile illness? no    Have you ever had a severe reaction to latex? no    Vaccine information given and explained to patient? yes    Are you currently pregnant? no    Lot Number:[id number]   Exp Date:01/21/2009   Site Given  Left Deltoid IM .medflu   Orders Added: 1)  Flu Vaccine 19yrs + [33825] 2)  Administration Flu vaccine [G0008]    ]

## 2010-08-24 NOTE — Progress Notes (Signed)
Summary: PRIOR AUTH APPROVED FOR ACTOS PRESCRIPTION SOLUTION  Phone Note Other Incoming   Call placed by: Vanessa Swaz[place],  August 03, 2007 4:09 PM Details for Reason: WAITING ON PRE AUTH APPROVEL Summary of Call: WAITING ON PRIOR AUTH APPROVEL FOR ACTOS 30 MG 1 BY MOUTH DAILY SENT TO [facility] ::: DR.HOPPER PT Initial call taken by: Vanessa [place],  August 03, 2007 4:11 PM  Follow-up for Phone Call        PRIOR AUTH APPROVED FOR ACTOS THROUGH 07/24/2038-PRESCRIPTION SOLUTION  [facility] FAXED...................................................................Marland KitchenKandice Hams  August 07, 2007 11:28 AM  Follow-up by: Kandice Hams,  August 07, 2007 11:28 AM

## 2010-08-24 NOTE — Assessment & Plan Note (Signed)
Summary: Discuss Chlosterol/scm   Vital Signs:  Patient profile:   70 year old male Weight:      250 pounds Pulse rate:   60 / minute BP sitting:   116 / 64  (left arm) Cuff size:   large  Vitals Entered By: Shonna Chock (Nov 30, 2009 11:46 AM) CC: Discuss risk and options for Elevated Chlosterol Comments REVIEWED MED LIST, PATIENT AGREED DOSE AND INSTRUCTION CORRECT    CC:  Discuss risk and options for Elevated Chlosterol.  History of Present Illness: Labs reduced & risks discussed with him & his wife; he has fatigue on Crestor which  his wife has restarted.He has had 10 stents & 30 catheterizations. His creatinine was 2.2  after last cath.His LDL goal = < 70.  Allergies: 1)  ! Lipitor (Atorvastatin) 2)  ! Zocor 3)  ! Crestor (Rosuvastatin Calcium)  Physical Exam  General:  well-nourished; alert,appropriate and cooperative throughout examination Heart:  Normal rate and regular rhythm. S1 and S2 normal without gallop, murmur, click, rub .S4 Pulses:  R and L carotid,radial,dorsalis pedis and posterior tibial pulses are full and equal bilaterally Psych:  Focused & motivated   Impression & Recommendations:  Problem # 1:  HYPERLIPIDEMIA (ICD-272.4)  The following medications were removed from the medication list:    Crestor 20 Mg Tabs (Rosuvastatin calcium) .Marland Kitchen... 1 at bedtime His updated medication list for this problem includes:    Trilipix 135 Mg Cpdr (Choline fenofibrate) .Marland Kitchen... 1 each am  Problem # 2:  RENAL INSUFFICIENCY (ICD-588.9)  Complete Medication List: 1)  Imdur 30 Mg Xr24h-tab (Isosorbide mononitrate) .Marland Kitchen.. 1 by mouth two times a day 2)  Paroxetine Hcl 30 Mg Tabs (Paroxetine hcl) .Marland Kitchen.. 1 two times a day 3)  Losartan Potassium 100 Mg Tabs (losartan Potassium)  .Marland Kitchen.. 1 by mouth once daily 4)  Fiber  .... 2 tabs daily 5)  Vit C 1000mg   6)  Aspirin Adult Low Strength 81 Mg Tbec (Aspirin) .Marland Kitchen.. 1 by mouth once daily 7)  Nitro-dur 0.4 Mg/hr Pt24 (Nitroglycerin) ....  As needed 8)  Carvedilol 25 Mg Tabs (Carvedilol) .Marland Kitchen.. 1 by mouth two times a day 9)  Effient 10 Mg Tabs (Prasugrel hcl) .Marland Kitchen.. 1 by mouth once daily 10)  Prilosec Otc 20 Mg Tbec (Omeprazole magnesium) .Marland Kitchen.. 1 by mouth  two times a day pre meals 11)  Ranexa 500 Mg Xr12h-tab (Ranolazine) .Marland Kitchen.. 1 by mouth two times a day 12)  Cyclobenzaprine Hcl 5 Mg Tabs (Cyclobenzaprine hcl) .Marland Kitchen.. 1 at bedtime as needed for headache 13)  Trilipix 135 Mg Cpdr (Choline fenofibrate) .Marland Kitchen.. 1 each am 14)  Onetouch Ultra Test Strp (Glucose blood) .... Onetouch ultra2 test strips dx: 790.29, check bloodsugar once daily  Patient Instructions: 1)  Consider options as discussed: Crestor once daily vs. Pravastatin 80 mg + Zetia 10 mg  with fasting labs in 10 weeks. Consume LESS THAN 40 grams of sugar / day from foods & drinks with High Fructose Corn Syrup as #1,2 or #3 on label. 2)  Please schedule a follow-up appointment in 3 months. 3)  BUN,creat, K+ ;Hepatic Panel ;CPK;Lipid Panel ;HbgA1C.

## 2010-08-24 NOTE — Assessment & Plan Note (Signed)
Summary: 8 WEEK FOLLOW UP/RH....   Vital Signs:  Patient profile:   70 year old male Weight:      246.8 pounds Pulse rate:   72 / minute Resp:     17 per minute BP sitting:   116 / 64  (left arm) Cuff size:   large  Vitals Entered By: Shonna Chock (November 18, 2009 8:31 AM) CC: 8 Week Follow-Up : Refill meds , Hypertension Management Comments REVIEWED MED LIST, PATIENT AGREED DOSE AND INSTRUCTION CORRECT    CC:  8 Week Follow-Up : Refill meds  and Hypertension Management.  History of Present Illness: Dr Ledell Noss 11/04/2009 OV reviewed: S/P stent RCA@ graft; unsuccessful CTO stent. Repeat stent attempt being considered.Angina improved , ? related to Ranexa 500 mg two times a day. Refills needed for Paxil, Losartan, & Fenofibrate. He is on low salt & decreased fried foods.He is on Pravastatin 80 mg at bedtime & fenofibrate . Exercise is yardwork ; prior to stent he could not walk 100 feet.  Hypertension History:      He complains of headache, visual symptoms, and side effects from treatment, but denies orthopnea, PND, neurologic problems, and syncope.  R  sharp occipital headaches upon reclining @ night in RLDP. ?meds cause cramps in legs(non exertional).        Positive major cardiovascular risk factors include male age 45 years old or older, diabetes, hyperlipidemia, hypertension, and family history for ischemic heart disease (males less than 25 years old).  Negative major cardiovascular risk factors include non-tobacco-user status.        Positive history for target organ damage include ASHD (either angina/prior MI/prior CABG) and peripheral vascular disease.  Further assessment for target organ damage reveals no history of stroke/TIA.     Allergies: 1)  ! Lipitor (Atorvastatin) 2)  ! Zocor  Review of Systems Eyes:  Complains of blurring; denies double vision and vision loss-both eyes. CV:  Complains of shortness of breath with exertion; denies leg cramps with exertion, palpitations,  swelling of feet, and swelling of hands; Minimal angina. MS:  Complains of joint pain, muscle aches, and cramps; denies joint redness and joint swelling; L shoulder almost locks. Neuro:  Denies numbness and tingling. Psych:  Denies anxiety, depression, easily angered, easily tearful, irritability, and panic attacks; Paroxetine is of benefit.  Physical Exam  General:  in no acute distress; alert,appropriate and cooperative throughout examination Neck:  No deformities, masses, or tenderness noted.Full ROM Lungs:  Normal respiratory effort, chest expands symmetrically. Lungs are clear to auscultation, no crackles or wheezes. Heart:  regular rhythm, no murmur, no gallop, no rub, no JVD, and bradycardia.  S4. Distant heart sounds Pulses:  R and L carotid,radial,dorsalis pedis and posterior tibial pulses are full and equal bilaterally Extremities:  No clubbing, cyanosis, edema. Absent great toenails. Deformity L distal index finger. Crepitus L shoulder & pain with ROM. Neg Homan's Neurologic:  alert & oriented X3 and DTRs symmetrical and 1.5+ Skin:  Intact without suspicious lesions or rashes Cervical Nodes:  No lymphadenopathy noted Axillary Nodes:  No palpable lymphadenopathy Psych:  memory intact for recent and remote, normally interactive, good eye contact, not anxious appearing, and not depressed appearing.     Impression & Recommendations:  Problem # 1:  CHEST PAIN (ICD-786.50) improved post stent & with Ranexa  Problem # 2:  MYALGIA (ICD-729.1)  on statin & fenofibrate His updated medication list for this problem includes:    Aspirin Adult Low Strength 81 Mg Tbec (Aspirin) .Marland KitchenMarland KitchenMarland KitchenMarland Kitchen  1 by mouth once daily    Cyclobenzaprine Hcl 5 Mg Tabs (Cyclobenzaprine hcl) .Marland Kitchen... 1 at bedtime as needed for headache  Orders: Venipuncture (16109) TLB-CK Total Only(Creatine Kinase/CPK) (82550-CK)  Problem # 3:  HEADACHE (ICD-784.0) nocturnal , positional His updated medication list for this problem  includes:    Aspirin Adult Low Strength 81 Mg Tbec (Aspirin) .Marland Kitchen... 1 by mouth once daily    Carvedilol 25 Mg Tabs (Carvedilol) .Marland Kitchen... 1 by mouth two times a day  Problem # 4:  SHOULDER PAIN, LEFT (ICD-719.41)  His updated medication list for this problem includes:    Aspirin Adult Low Strength 81 Mg Tbec (Aspirin) .Marland Kitchen... 1 by mouth once daily    Cyclobenzaprine Hcl 5 Mg Tabs (Cyclobenzaprine hcl) .Marland Kitchen... 1 at bedtime as needed for headache  Problem # 5:  HYPERLIPIDEMIA (ICD-272.4)  His updated medication list for this problem includes:    Fenofibrate 160 Mg Tabs (Fenofibrate) .Marland Kitchen... 1 once daily in am    Pravastatin Sodium 80 Mg Tabs (Pravastatin sodium) .Marland Kitchen... 1 by mouth at bedtime  Orders: Venipuncture (60454) TLB-Lipid Panel (80061-LIPID) TLB-Hepatic/Liver Function Pnl (80076-HEPATIC)  Problem # 6:  ANXIETY STATE, UNSPECIFIED (ICD-300.00) stable , controlled His updated medication list for this problem includes:    Paroxetine Hcl 30 Mg Tabs (Paroxetine hcl) .Marland Kitchen... 1 two times a day  Problem # 7:  RENAL INSUFFICIENCY (ICD-588.9) PMH of, S/P stenting of RCA  Orders: Venipuncture (09811) TLB-Renal Function Panel (80069-RENAL)  Complete Medication List: 1)  Fenofibrate 160 Mg Tabs (Fenofibrate) .Marland Kitchen.. 1 once daily in am 2)  Imdur 30 Mg Xr24h-tab (Isosorbide mononitrate) .Marland Kitchen.. 1 by mouth two times a day 3)  Paroxetine Hcl 30 Mg Tabs (Paroxetine hcl) .Marland Kitchen.. 1 two times a day 4)  Losartan Potassium 100 Mg Tabs (losartan Potassium)  .Marland Kitchen.. 1 by mouth once daily 5)  Fiber  .... 2 tabs daily 6)  Vit C 1000mg   7)  Pravastatin Sodium 80 Mg Tabs (Pravastatin sodium) .Marland Kitchen.. 1 by mouth at bedtime 8)  Aspirin Adult Low Strength 81 Mg Tbec (Aspirin) .Marland Kitchen.. 1 by mouth once daily 9)  Nitro-dur 0.4 Mg/hr Pt24 (Nitroglycerin) .... As needed 10)  Carvedilol 25 Mg Tabs (Carvedilol) .Marland Kitchen.. 1 by mouth two times a day 11)  Effient 10 Mg Tabs (Prasugrel hcl) .Marland Kitchen.. 1 by mouth once daily 12)  Prilosec Otc 20 Mg Tbec  (Omeprazole magnesium) .Marland Kitchen.. 1 by mouth  two times a day pre meals 13)  Ranexa 500 Mg Xr12h-tab (Ranolazine) .Marland Kitchen.. 1 by mouth two times a day 14)  Cyclobenzaprine Hcl 5 Mg Tabs (Cyclobenzaprine hcl) .Marland Kitchen.. 1 at bedtime as needed for headache  Hypertension Assessment/Plan:      The patient's hypertensive risk group is category C: Target organ damage and/or diabetes.  Today's blood pressure is 116/64.    Patient Instructions: 1)  Stop Fenofibrate until labs back. Use Cervical Pillow @ night.Share records with Dr Beverely Pace. Prescriptions: CYCLOBENZAPRINE HCL 5 MG TABS (CYCLOBENZAPRINE HCL) 1 at bedtime as needed for headache  #30 x 5   Entered and Authorized by:   Marga Melnick MD   Signed by:   Marga Melnick MD on 11/18/2009   Method used:   Print then Give to Patient   RxID:   [id number] FENOFIBRATE 160 MG  TABS (FENOFIBRATE) 1 once daily in am  #30 x 5   Entered and Authorized by:   Marga Melnick MD   Signed by:   Marga Melnick MD on 11/18/2009   Method used:  Print then Give to Patient   RxID:   [id number] LOSARTAN POTASSIUM 100 MG TABS (LOSARTAN POTASSIUM) 1 by mouth once daily  #30 x 5   Entered and Authorized by:   Marga Melnick MD   Signed by:   Marga Melnick MD on 11/18/2009   Method used:   Print then Give to Patient   RxID:   [id number] PAROXETINE HCL 30 MG  TABS (PAROXETINE HCL) 1 two times a day  #90 Tablet x 1   Entered and Authorized by:   Marga Melnick MD   Signed by:   Marga Melnick MD on 11/18/2009   Method used:   Print then Give to Patient   RxID:   [id number]

## 2010-08-24 NOTE — Progress Notes (Signed)
Summary: Lab Results  Phone Note Outgoing Call Call back at [facility] Phone [phone]   Call placed by: Shonna Chock,  November 19, 2009 10:01 AM Call placed to: Patient Summary of Call: Spoke with patient, patient ok'd all information below  Lipids are not @ goal; kidney impairment present & glucose significantly elevated.No muscle injury present. PLease pick up samples & Rxs foor Crestor 20 mg at bedtime & Trilipx 135 each am.This combination should be safest in reference to muscle injury risk. Repeat fasting labs in 10 weeks(lipids, hepatic panel, CPK, A1c,BUN,creat ,K+; 790.29, 272.4,995.20). Consume LESS THAN 40 grams of "sugar"/ day from foods & drinks with High Fructose Corn Syrup as #1,2 or # 3 on label. Drink to thirst , up to 40 oz of water / day. Hopp  Patient will stop by to pick up a copy of labs and samples  [facility]  November 19, 2009 10:02 AM

## 2010-08-24 NOTE — Progress Notes (Signed)
Summary:  lab  Phone Note Call from Patient   Summary of Call: per note ov [phone] patient is to return  for lab -- BUN,creat, K+ ;Hepatic Panel ;CPK;Lipid Panel ;HbgA1C. - scheduled [id number] need dx  Initial call taken by: Okey Regal Spring,  January 26, 2010 10:34 AM  Follow-up for Phone Call        Use codes: 588.9, 790.6, 272.4, 790.29 and 401.9.  Nicki Guadalajara Fergerson CMA Duncan Dull)  January 26, 2010 11:33 AM   dx added to lab appt .Marland KitchenOkey Regal Spring  January 27, 2010 2:20 PM

## 2010-08-24 NOTE — Letter (Signed)
Summary: [facility]   Imported By: Lanelle Bal 11/16/2009 11:24:09  _____________________________________________________________________  External Attachment:    Type:   Image     Comment:   External Document

## 2010-08-24 NOTE — Letter (Signed)
Summary: [facility]   Imported By: Lanelle Bal 04/12/2010 11:57:07  _____________________________________________________________________  External Attachment:    Type:   Image     Comment:   External Document

## 2010-08-24 NOTE — Assessment & Plan Note (Signed)
Summary: rto review lab/cbs   Vital Signs:  Patient profile:   70 year old male Weight:      252.4 pounds BMI:     36.87 Pulse rate:   56 / minute Resp:     15 per minute BP sitting:   146 / 72  (left arm) Cuff size:   large  Vitals Entered By: Shonna Chock CMA (February 16, 2010 8:50 AM) CC: Follow-up visit: discuss labs, copy given, Headaches   CC:  Follow-up visit: discuss labs, copy given, and Headaches.  History of Present Illness: Hyperlipidemia Follow-Up      This is a 70 year old man who presents for Hyperlipidemia follow-up.  The patient reports fatigue, but denies muscle aches, GI upset, abdominal pain, flushing, itching, constipation, and diarrhea.  Other symptoms include dypsnea.  The patient denies the following symptoms: chest pain/pressure, exercise intolerance, palpitations, syncope, and pedal edema.  Compliance with medications (by patient report) has been near 100%.  Dietary compliance has been good  to  fair.  The patient reports no exercise.  Adjunctive measures currently used by the patient include ASA.  Labs reviewed; LDL 107 ,down from 122. TG still mildly elevated. Hypertension Follow-Up      The patient also presents for Hypertension follow-up.  The patient reports lightheadedness and headaches, but denies urinary frequency and rash.  Compliance with medications (by patient report) has been near 100%.  Adjunctive measures currently used by the patient include salt restriction.  BP @ home averages 134/84. Headaches      The patient also presents with Headaches.  The patient denies tearing of eyes, nasal congestion, sinus pain, sinus pressure, photophobia, and phonophobia.  The headache is described as intermittent and  "just below"sharp.  The location of the pain is occipital.  The patient denies the following high-risk features: fever, vision loss or change, focal weakness, and pain worse with exertion.  Effective  treatment has included  Rxed pain  medication.   Ophthalmology exam negative in 12/2009.   Allergies: 1)  ! Lipitor (Atorvastatin) 2)  ! Zocor 3)  ! Crestor (Rosuvastatin Calcium)  Physical Exam  General:    well-nourished,in no acute distress; alert,appropriate and cooperative throughout examination Eyes:  No corneal or conjunctival inflammation noted. EOMI. Perrla. F ield of  Vision grossly normal. Speckled irises Mouth:  Oral mucosa and oropharynx without lesions or exudates.  Teeth in good repair. No tongue deviation Lungs:  Normal respiratory effort, chest expands symmetrically. Lungs are clear to auscultation, no crackles or wheezes. Heart:  regular rhythm, no murmur, no gallop, no rub, no JVD, and bradycardia.   S4 Pulses:  R and L carotid,radial,dorsalis pedis and posterior tibial pulses are full and equal bilaterally Extremities:  No clubbing, cyanosis, edema. Neurologic:  alert & oriented X3, cranial nerves II-XII intact, strength normal in all extremities, sensation intact to light touch, and DTRs symmetrical and 1& 1/2+  Skin:  Intact without suspicious lesions or rashes. Slightly damp Psych:  memory intact for recent and remote, normally interactive, and good eye contact.     Impression & Recommendations:  Problem # 1:  HEADACHE (ICD-784.0) R occipital with neg neuro exam His updated medication list for this problem includes:    Aspirin Adult Low Strength 81 Mg Tbec (Aspirin) .Marland Kitchen... 1 by mouth once daily    Carvedilol 25 Mg Tabs (Carvedilol) .Marland Kitchen... 1 by mouth two times a day  Problem # 2:  HYPERLIPIDEMIA (ICD-272.4) improved His updated medication list for this problem includes:  Trilipix 135 Mg Cpdr (Choline fenofibrate) .Marland Kitchen... 1 each am    Crestor 20 Mg Tabs (Rosuvastatin calcium) .Marland Kitchen... 1 by mouth at bedtime  Problem # 3:  HYPERGLYCEMIA (ICD-790.29) A1c in  non Diabetic range  Problem # 4:  UNSPECIFIED ESSENTIAL HYPERTENSION (ICD-401.9) controlled by history His updated medication list for this problem  includes:    Losartan Potassium 100 Mg Tabs (Losartan potassium) .Marland Kitchen... 1 by mouth once daily    Carvedilol 25 Mg Tabs (Carvedilol) .Marland Kitchen... 1 by mouth two times a day  Complete Medication List: 1)  Imdur 30 Mg Xr24h-tab (Isosorbide mononitrate) .Marland Kitchen.. 1 by mouth two times a day 2)  Paroxetine Hcl 30 Mg Tabs (Paroxetine hcl) .Marland Kitchen.. 1 two times a day 3)  Losartan Potassium 100 Mg Tabs (Losartan potassium) .Marland Kitchen.. 1 by mouth once daily 4)  Fiber  .... 2 tabs daily 5)  Vit C 1000mg   6)  Aspirin Adult Low Strength 81 Mg Tbec (Aspirin) .Marland Kitchen.. 1 by mouth once daily 7)  Nitro-dur 0.4 Mg/hr Pt24 (Nitroglycerin) .... As needed 8)  Carvedilol 25 Mg Tabs (Carvedilol) .Marland Kitchen.. 1 by mouth two times a day 9)  Effient 10 Mg Tabs (Prasugrel hcl) .Marland Kitchen.. 1 by mouth once daily 10)  Prilosec Otc 20 Mg Tbec (Omeprazole magnesium) .Marland Kitchen.. 1 by mouth  two times a day pre meals 11)  Ranexa 500 Mg Xr12h-tab (Ranolazine) .Marland Kitchen.. 1 by mouth two times a day 12)  Cyclobenzaprine Hcl 5 Mg Tabs (Cyclobenzaprine hcl) .Marland Kitchen.. 1 at bedtime as needed for headache 13)  Trilipix 135 Mg Cpdr (Choline fenofibrate) .Marland Kitchen.. 1 each am 14)  Onetouch Ultra Test Strp (Glucose blood) .... Onetouch ultra2 test strips dx: 790.29, check bloodsugar once daily 15)  Crestor 20 Mg Tabs (Rosuvastatin calcium) .Marland Kitchen.. 1 by mouth at bedtime  Patient Instructions: 1)  Consume < 40 grams of sugar/ day from High Fructose Corn Syrup sources. Keep Headache Diary as discussed.Please schedule a follow-up appointment in 6 months. 2)  BUN,creat, K+ prior to visit, ICD-9:401.9 3)  Hepatic Panel prior to visit, ICD-9:995.20 4)  Lipid Panel prior to visit, ICD-9:272.4 5)  HbgA1C prior to visit, ICD-9:790.29; CPK : 995.20

## 2010-08-24 NOTE — Progress Notes (Signed)
Summary: Stopped Crestor  Phone Note Call from Patient Call back at [employer] [phone]   Caller: Patient Summary of Call: Patient was having bad side effects with the Crestor samples you have him, muscle pain, headaches, etc. So he stopped the medication. What would you like him to do now? Initial call taken by: Harold Barban,  Nov 27, 2009 11:24 AM  Follow-up for Phone Call        That is safest & most effective way to treat the very high risk from high cholesterol. I am on 20 mg once daily . Please come in with his spouse to discuss risks & options. I'll review the pros & cons of all options Follow-up by: Marga Melnick MD,  Nov 27, 2009 1:26 PM  Additional Follow-up for Phone Call Additional follow up Details #1::        Spoke with patient, scheduled appointment for Monday Additional Follow-up by: Shonna Chock,  Nov 27, 2009 1:46 PM

## 2010-08-24 NOTE — Medication Information (Signed)
[facility]   Imported By: Freddy Jaksch 07/31/2007 09:07:08  _____________________________________________________________________  External Attachment:    Type:   Image     Comment:   External Document

## 2010-08-24 NOTE — Assessment & Plan Note (Signed)
Summary: hosp f/u/cbs   Vital Signs:  Patient profile:   70 year old male Weight:      251 pounds BMI:     36.67 Temp:     98.2 degrees F oral Pulse rate:   56 / minute Resp:     17 per minute BP sitting:   114 / 70  (left arm) Cuff size:   large  Vitals Entered By: Shonna Chock CMA (April 22, 2010 11:19 AM) CC: Hospital Follow-up and discuss meds (Trilipix and Crestor), Type 2 diabetes mellitus follow-up   CC:  Hospital Follow-up and discuss meds (Trilipix and Crestor) and Type 2 diabetes mellitus follow-up.  History of Present Illness:    He was hospitalized for 2 days with chest pain by Dr Beverely Pace. "Three blockages opened by Dr Beverely Pace". Crestor 40 mg & Trilipix Rxed. He has had some chest wall pain related to remote trauma @ age 54. "Cracked rib " found on Xrays 1966. He questions these as causing non anginal chest pain. Type 2 Diabetes Mellitus Follow-Up       The patient reports blurred vision, but denies polyuria, polydipsia, self managed hypoglycemia, weight loss, weight gain, and numbness of extremities.  Other symptoms include  occasional orthostatic symptoms, poor wound healing, and intermittent claudication(Dr Cheek aware).  The patient denies the following symptoms: neuropathic pain, vomiting, vision loss, and foot ulcer.  Since the last visit the patient reports good dietary compliance  but is  not exercising regularly.  The patient has been measuring capillary blood glucose before breakfast,  range 108-120+.  A1c was 5.9% in 01/2010.  Current Medications (verified): 1)  Imdur 30 Mg Xr24h-Tab (Isosorbide Mononitrate) .Marland Kitchen.. 1 By Mouth Two Times A Day 2)  Paroxetine Hcl 30 Mg  Tabs (Paroxetine Hcl) .Marland Kitchen.. 1 Two Times A Day 3)  Losartan Potassium 100 Mg Tabs (Losartan Potassium) .Marland Kitchen.. 1 By Mouth Once Daily 4)  Fiber .... 2 Tabs Daily 5)  Vit C 1000mg  6)  Aspirin Adult Low Strength 81 Mg Tbec (Aspirin) .Marland Kitchen.. 1 By Mouth Once Daily 7)  Nitro-Dur 0.4 Mg/hr Pt24 (Nitroglycerin) ....  As Needed 8)  Carvedilol 25 Mg Tabs (Carvedilol) .Marland Kitchen.. 1 By Mouth Two Times A Day 9)  Effient 10 Mg Tabs (Prasugrel Hcl) .Marland Kitchen.. 1 By Mouth Once Daily 10)  Prilosec Otc 20 Mg Tbec (Omeprazole Magnesium) .Marland Kitchen.. 1 By Mouth  Two Times A Day Pre Meals 11)  Ranexa 500 Mg Xr12h-Tab (Ranolazine) .Marland Kitchen.. 1 By Mouth Two Times A Day 12)  Trilipix 135 Mg Cpdr (Choline Fenofibrate) .Marland Kitchen.. 1 Each Am 13)  Onetouch Ultra Test  Strp (Glucose Blood) .... Onetouch Ultra2 Test Strips Dx: 790.29, Check Bloodsugar Once Daily 14)  Crestor 40 Mg Tabs (Rosuvastatin Calcium) .Marland Kitchen.. 1 By Mouth At Bedtime  Allergies: 1)  ! Lipitor (Atorvastatin) 2)  ! Zocor 3)  ! Crestor (Rosuvastatin Calcium)  Physical Exam  General:  in no acute distress; alert,appropriate and cooperative throughout examination Chest Wall:  Classic L anterior costochondritis findings with palpation Lungs:  Normal respiratory effort, chest expands symmetrically. Lungs are clear to auscultation, no crackles or wheezes. Heart:  regular rhythm, no murmur, no gallop, no rub, no JVD, and bradycardia.  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 1 by mouth two times  a day 2)  Paroxetine Hcl 30 Mg Tabs (Paroxetine hcl) .Marland Kitchen.. 1 two times a day 3)  Losartan Potassium 100 Mg Tabs (Losartan potassium) .Marland Kitchen.. 1 by mouth once daily 4)  Fiber  .... 2 tabs daily 5)  Vit C 1000mg   6)  Aspirin Adult Low Strength 81 Mg Tbec (Aspirin) .Marland Kitchen.. 1 by mouth once daily 7)  Nitro-dur 0.4 Mg/hr Pt24 (Nitroglycerin) .... As needed 8)  Carvedilol 25 Mg Tabs (Carvedilol) .Marland Kitchen.. 1 by mouth two times a day 9)  Effient 10 Mg Tabs (Prasugrel hcl) .Marland Kitchen.. 1 by mouth once daily 10)  Prilosec Otc 20 Mg Tbec (Omeprazole magnesium) .Marland Kitchen.. 1 by  mouth  two times a day pre meals 11)  Ranexa 500 Mg Xr12h-tab (Ranolazine) .Marland Kitchen.. 1 by mouth two times a day 12)  Trilipix 135 Mg Cpdr (Choline fenofibrate) .Marland Kitchen.. 1 each am 13)  Onetouch Ultra Test Strp (Glucose blood) .... Onetouch ultra2 test strips dx: 790.29, check bloodsugar once daily 14)  Crestor 40 Mg Tabs (Rosuvastatin calcium) .Marland Kitchen.. 1 by mouth at bedtime  Patient Instructions: 1)  Aspercreme or Zostrix cream  OR heating pad  as needed for chest wall .Consume LESS THAN 40 grams of High Fructose Corn Syrup sugar/ day to prevent rise in Triglycerides ("liquid fat")& uric acid(causes gout)

## 2010-08-24 NOTE — Cardiovascular Report (Signed)
Summary: [facility]   Imported By: Lanelle Bal 04/13/2010 11:13:20  _____________________________________________________________________  External Attachment:    Type:   Image     Comment:   External Document

## 2010-08-24 NOTE — Letter (Signed)
Summary: [facility]   Imported By: Lanelle Bal 01/09/2010 11:39:39  _____________________________________________________________________  External Attachment:    Type:   Image     Comment:   External Document

## 2010-08-24 NOTE — Progress Notes (Signed)
Summary: Refill request  Phone Note Refill Request Call back at Home Phone [phone] Message from:  Patient  Refills Requested: Medication #1:  One touch ultra 2 test strips [facility] [place]   Method Requested: Electronic Initial call taken by: Shonna Chock,  November 19, 2009 12:18 PM    New/Updated Medications: ONETOUCH ULTRA TEST  STRP (GLUCOSE BLOOD) OneTouch Ultra2 test strips DX: 790.29, check bloodsugar once daily Prescriptions: ONETOUCH ULTRA TEST  STRP (GLUCOSE BLOOD) OneTouch Ultra2 test strips DX: 790.29, check bloodsugar once daily  #100 x 3   Entered by:   Shonna Chock   Authorized by:   Marga Melnick MD   Signed by:   Shonna Chock on 11/19/2009   Method used:   [facility]ronically to        [facility] [address] [phone][facility] (retail)       [address]       [place], [place]  [address]       Ph: [phone] or [phone]       Fax: [phone]   RxID:   [id number]

## 2010-08-24 NOTE — Letter (Signed)
Summary: Clinical Summary/[facility]  Clinical Summary/ [facility]   Imported By: Lanelle Bal 04/30/2010 12:55:08  _____________________________________________________________________  External Attachment:    Type:   Image     Comment:   External Document

## 2010-08-24 NOTE — Assessment & Plan Note (Signed)
Summary: review lab,discuss med change.cbs   Vital Signs:  Patient Profile:   70 Years Old Male Weight:      254 pounds Pulse rate:   68 / minute Resp:     15 per minute BP sitting:   108 / 70  (left arm)  Pt. in pain?   no  Vitals Entered By: Doristine Devoid (August 02, 2007 10:04 AM)                  Chief Complaint:  reveiw labs and medication and Type 2 diabetes mellitus follow-up.  History of Present Illness:  Type 2 Diabetes Mellitus Follow-Up      This is a 70 year old man who presents for Type 2 diabetes mellitus follow-up.  FBS averages 93-104; pc not checked; on Actos 45 mg once daily; recurrent cp despite normal cath  11/08 by Dr Jenne Campus, [facility].  The patient reports weight gain, but denies polyuria, polydipsia, blurred vision, self managed hypoglycemia, hypoglycemia requiring help, weight loss, and numbness of extremities.  Other symptoms include chest pain.  The patient denies the following symptoms: neuropathic pain, vomiting, orthostatic symptoms, poor wound healing, intermittent claudication, vision loss, and foot ulcer.  Since the last visit the patient reports good dietary compliance, compliance with medications, not exercising regularly, and monitoring blood glucose.  The patient has been measuring capillary blood glucose before breakfast.  Since the last visit, the patient reports having had no eye care and no foot care.  Dr Shelva Majestic questioned Metformin; but creat 1.8.(  see labs)  Lipid Management History:      Positive NCEP/ATP III risk factors include male age 69 years old or older, diabetes, family history for ischemic heart disease (males less than 58 years old), hypertension, ASHD (atherosclerotic heart disease), and peripheral vascular disease.  Negative NCEP/ATP III risk factors include non-tobacco-user status, no prior stroke/TIA, and no history of aortic aneurysm.     Current Allergies: No known allergies     Risk Factors:  Tobacco use:   never  Family History Risk Factors:    Family History of MI in females < 16 years old:  no    Family History of MI in males < 9 years old:  yes    Physical Exam  General:     Well-developed,well-nourished,in no acute distress; alert,appropriate and cooperative throughout examination Lungs:     Normal respiratory effort, chest expands symmetrically. Lungs are clear to auscultation, no crackles or wheezes. Heart:     Normal rate and regular rhythm. S1 and S2 normal without gallop, murmur, click, rub or other extra sounds. Abdomen:     Bowel sounds positive,abdomen soft and non-tender without masses, organomegaly. Large ventral  hernia; multiple op scars. Dullness to percussion RUQ. Pulses:     R and L carotid,radial,dorsalis pedis and posterior tibial pulses are full and equal bilaterally Extremities:     Absent great toenails; pes planus Skin:     Intact without suspicious lesions or rashes Psych:     normally interactive, good eye contact, not anxious appearing, and not depressed appearing.      Impression & Recommendations:  Problem # 1:  C A D (ICD-414.00)  His updated medication list for this problem includes:    Plavix 75 Mg Tabs (Clopidogrel bisulfate) .Marland Kitchen... Take one tablet daily    Atenolol 25 Mg Tabs (Atenolol) .Marland Kitchen... Take one tablet daily    Benicar 40 Mg Tabs (Olmesartan medoxomil) .Marland Kitchen... Take one tablet daily  Imdur 30 Mg Tb24 (Isosorbide mononitrate) .Marland Kitchen... Take one tablet daily    Sg Enteric Coated Aspirin 325 Mg Tbec (Aspirin) .Marland Kitchen... Take one daily   Problem # 2:  OTHER ABNORMAL BLOOD CHEMISTRY (ICD-790.6)  Problem # 3:  HYPERLIPIDEMIA (ICD-272.2)  His updated medication list for this problem includes:    Tricor 145 Mg Tabs (Fenofibrate) .Marland Kitchen... Take one tablet daily    Simvastatin 20 Mg Tabs (Simvastatin) .Marland Kitchen... Take one tablet daily   Problem # 4:  RENAL INSUFFICIENCY, CHRONIC (ICD-585.9)  Problem # 5:  UNSPECIFIED ESSENTIAL HYPERTENSION  (ICD-401.9)  His updated medication list for this problem includes:    Atenolol 25 Mg Tabs (Atenolol) .Marland Kitchen... Take one tablet daily    Benicar 40 Mg Tabs (Olmesartan medoxomil) .Marland Kitchen... Take one tablet daily   Complete Medication List: 1)  Actos 30 Mg Tabs (pioglitazone Hcl)  .Marland Kitchen.. 1 by mouth once daily 2)  Tricor 145 Mg Tabs (Fenofibrate) .... Take one tablet daily 3)  Plavix 75 Mg Tabs (Clopidogrel bisulfate) .... Take one tablet daily 4)  Atenolol 25 Mg Tabs (Atenolol) .... Take one tablet daily 5)  Benicar 40 Mg Tabs (Olmesartan medoxomil) .... Take one tablet daily 6)  Imdur 30 Mg Tb24 (Isosorbide mononitrate) .... Take one tablet daily 7)  Paroxetine Hcl 30 Mg Tabs (Paroxetine hcl) .... Take one half tablet in morning and one half tablet in evening 8)  Simvastatin 20 Mg Tabs (Simvastatin) .... Take one tablet daily 9)  Sg Enteric Coated Aspirin 325 Mg Tbec (Aspirin) .... Take one daily 10)  Prilosec 20 Mg Cpdr (Omeprazole) .... Take one tablet daily 11)  Tramadol Hcl 50 Mg Tabs (Tramadol hcl) .Marland Kitchen.. 1 q 6-8 hrs as needed bback pain  Lipid Assessment/Plan:      Based on NCEP/ATP III, the patient's risk factor category is "history of coronary disease, peripheral vascular disease, cerebrovascular disease, or aortic aneurysm along with either diabetes, current smoker, or LDL > 130 plus HDL < 40 plus triglycerides > 200".  From this information, the patient's calculated lipid goals are as follows: Total cholesterol goal is 200; LDL cholesterol goal is 70; HDL cholesterol goal is 40; Triglyceride goal is 150.  His LDL cholesterol goal has not been met.  Secondary causes for hyperlipidemia have been ruled out.  He has been counseled on adjunctive measures for lowering his cholesterol and has been provided with dietary instructions.     Patient Instructions: 1)  Please schedule a follow-up appointment in 4 months. 2)  BUN,creat, K+ prior to visit, ICD-9:401.9 3)  Hepatic Panel prior to visit,  ICD-9:995.20 4)  Lipid Panel prior to visit, ICD-9:272.4 5)  HbgA1C prior to visit, ICD-9:  790.6. Follow [employer] low carb diet.Check on least expensive fenofibrate on your plan  (Tricor, Antara, etc)    Prescriptions: TRICOR 145 MG  TABS (FENOFIBRATE) TAKE ONE TABLET DAILY  #30 x 5   Entered and Authorized by:   Marga Melnick MD   Signed by:   Marga Melnick MD on 08/02/2007   Method used:   Print then Give to Patient   RxID:   [id number] ACTOS 30 MG TABS (PIOGLITAZONE HCL) 1 by mouth once daily  #30 x 5   Entered and Authorized by:   Marga Melnick MD   Signed by:   Marga Melnick MD on 08/02/2007   Method used:   Print then Give to Patient   RxID:   [id number]  ]

## 2010-08-24 NOTE — Cardiovascular Report (Signed)
Summary: [facility]   Imported By: Lanelle Bal 10/30/2009 11:38:20  _____________________________________________________________________  External Attachment:    Type:   Image     Comment:   External Document
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
## 2010-08-24 NOTE — Letter (Signed)
Summary: [facility]   Imported By: Lanelle Bal 06/08/2010 10:36:56  _____________________________________________________________________  External Attachment:    Type:   Image     Comment:   External Document

## 2010-08-26 ENCOUNTER — Encounter (INDEPENDENT_AMBULATORY_CARE_PROVIDER_SITE_OTHER): Payer: MEDICARE

## 2010-08-26 ENCOUNTER — Encounter: Payer: Self-pay | Admitting: Internal Medicine

## 2010-08-26 DIAGNOSIS — R05 Cough: Secondary | ICD-10-CM

## 2010-08-26 NOTE — Progress Notes (Signed)
Summary: Continued cough  Phone Note Call from Patient Call back at Home Phone [phone]   Summary of Call: Patient left message on triage that he was seen on 12/8 but continues to have a cough. He would like to know if something can be sent to his pharmacy. Please advise. Initial call taken by: Lucious Groves CMA,  July 29, 2010 8:43 AM  Follow-up for Phone Call        sent to [facility] Follow-up by: Marga Melnick MD,  July 29, 2010 2:10 PM    New/Updated Medications: PREDNISONE 20 MG TABS (PREDNISONE) 1/2 three times a day with meals BENZONATATE 100 MG CAPS (BENZONATATE) 1 every 6 hr as needed cough Prescriptions: BENZONATATE 100 MG CAPS (BENZONATATE) 1 every 6 hr as needed cough  #15 x 0   Entered and Authorized by:   Marga Melnick MD   Signed by:   Marga Melnick MD on 07/29/2010   Method used:   [facility] to        [facility] [address] [phone]* (retail)       [address]       [place], [place]  [address]       Ph: [phone] or [phone]       Fax: [phone]   RxID:   [id number] PREDNISONE 20 MG TABS (PREDNISONE) 1/2 three times a day with meals  #9 x 0   Entered and Authorized by:   Marga Melnick MD   Signed by:   Marga Melnick MD on 07/29/2010   Method used:   [facility]ronically to        [facility] [address] [phone]* (retail)       [address]       [place], [place]  [address]       Ph: [phone] or [phone]       Fax: [phone]   RxID:   [id number]

## 2010-08-26 NOTE — Letter (Signed)
Summary: Colonoscopy Letter  [facility]  [address] [place], [place] [address]   Phone: [phone]  Fax: [phone]      August 04, 2010 MRN: [id number]   Bryan [name] [address] [place], [place]  [address]   Dear [name],   According to your medical record, it is time for you to schedule a Colonoscopy. The American Cancer Society recommends this procedure as a method to detect early colon cancer. Patients with a family history of colon cancer, or a personal history of colon polyps or inflammatory bowel disease are at increased risk.  This letter has been generated based on the recommendations made at the time of your procedure. If you feel that in your particular situation this may no longer apply, please contact our office.  Please call our office at [phone] to schedule this appointment or to update your records at your earliest convenience.  Thank you for cooperating with [place] to provide you with the very best care possible.   Sincerely,  Judie Petit T. Russella Dar, M.D.  [facility] [phone]

## 2010-08-26 NOTE — Assessment & Plan Note (Signed)
Summary: cough/nta   Vital Signs:  Patient profile:   70 year old male Weight:      252 pounds BMI:     36.81 Temp:     98.1 degrees F oral Pulse rate:   64 / minute Resp:     15 per minute BP sitting:   130 / 74  (left arm) Cuff size:   large  Vitals Entered By: Shonna Chock CMA (August 16, 2010 1:09 PM) CC: 1.) Cough   2.) Examine right shoulder, injured 3 weeks agp   CC:  1.) Cough   2.) Examine right shoulder and injured 3 weeks agp.  History of Present Illness:      This is a 70 year old man who presents with Coughpresent 2-3 months.  The patient reports mainly a non-productive, paroxysmal  cough and shortness of breath, but denies wheezing, fever, and hemoptysis.  The patient denies the following symptoms: cold/URI symptoms, sore throat, nasal congestion, weight loss, acid reflux symptoms, and peripheral edema.  The cough  has no specific trigger.  Ineffective prior treatments have included OTC cough medication, Robitussin.No PMH of asthma.Not on ACE-I. Never smoked.  CXray ? 2 weeks ago @ [facility]  was ? "normal".  He was observed for 24 hrs due to Mental Status changes  from "brain losing oxygen " after episode of exertional chest pain.                                                            The patient also presents with An injury 3 weeks ago.  The patient reports injury to the right clavicular area after falling out of bed. Films neg @ UC ; diagnosis of "bruised" collar bone.  The patient  reports tenderness; he  denies swelling and redness. Rx: Tylenol with partial benefit.  Current Medications (verified): 1)  Imdur 30 Mg Xr24h-Tab (Isosorbide Mononitrate) .Marland Kitchen.. 1 By Mouth Two Times A Day 2)  Paroxetine Hcl 30 Mg  Tabs (Paroxetine Hcl) .Marland Kitchen.. 1 Two Times A Day 3)  Losartan Potassium 100 Mg Tabs (Losartan Potassium) .Marland Kitchen.. 1 By Mouth Once Daily 4)  Fiber .... 2 Tabs Daily 5)  Vit C 1000mg  6)  Aspirin Adult Low Strength 81 Mg Tbec (Aspirin) .Marland Kitchen.. 1 By Mouth Once Daily 7)  Nitro-Dur  0.4 Mg/hr Pt24 (Nitroglycerin) .... As Needed 8)  Carvedilol 25 Mg Tabs (Carvedilol) .Marland Kitchen.. 1 By Mouth Two Times A Day 9)  Effient 10 Mg Tabs (Prasugrel Hcl) .Marland Kitchen.. 1 By Mouth Once Daily 10)  Prilosec Otc 20 Mg Tbec (Omeprazole Magnesium) .Marland Kitchen.. 1 By Mouth  Two Times A Day Pre Meals 11)  Ranexa 500 Mg Xr12h-Tab (Ranolazine) .Marland Kitchen.. 1 By Mouth Two Times A Day 12)  Trilipix 135 Mg Cpdr (Choline Fenofibrate) .Marland Kitchen.. 1 Each Am **appointment Due Now** 13)  Onetouch Ultra Test  Strp (Glucose Blood) .... Onetouch Ultra2 Test Strips Dx: 790.29, Check Bloodsugar Once Daily 14)  Crestor 40 Mg Tabs (Rosuvastatin Calcium) .Marland Kitchen.. 1 By Mouth At Bedtime  Allergies: 1)  ! Lipitor (Atorvastatin) 2)  ! Zocor 3)  ! Crestor (Rosuvastatin Calcium)  Review of Systems Neuro:  Denies brief paralysis, numbness, tingling, and weakness.  Physical Exam  General:  in no acute distress; alert,appropriate and cooperative throughout examination Ears:  External ear exam shows  no significant lesions or deformities.  Otoscopic examination reveals clear canals, tympanic membranes are intact bilaterally without bulging, retraction, inflammation or discharge. Hearing is grossly normal bilaterally. Nose:  External nasal examination shows no deformity or inflammation. Nasal mucosa are pink and moist without lesions or exudates. Mouth:  Oral mucosa and oropharynx without lesions or exudates.  Teeth in good repair. Lungs:  Normal respiratory effort, chest expands symmetrically. Lungs : mild rhonchi & rales @ bases w/o increased WOB. Intermittent dry cough Heart:  Normal rate and regular rhythm. S1 and S2 normal without gallop, click, rub . Grade 1 systolic murmur LSB Extremities:  No clubbing, cyanosis, edema, or deformity noted . Pain with passive or active ROM R shoulder;essentially  normal full range of motion. Neurologic:  strength normal in all extremities and DTRs symmetrical and normal.   Cervical Nodes:  No lymphadenopathy  noted Axillary Nodes:  No palpable lymphadenopathy   Impression & Recommendations:  Problem # 1:  COUGH (ICD-786.2)  R/O RAD  Orders: T-2 View CXR (71020TC) Misc. Referral (Misc. Ref)  Problem # 2:  SHOULDER IMPINGEMENT SYNDROME, RIGHT (ICD-726.2)  Orders: Physical Therapy Referral (PT)  Complete Medication List: 1)  Imdur 30 Mg Xr24h-tab (Isosorbide mononitrate) .Marland Kitchen.. 1 by mouth two times a day 2)  Paroxetine Hcl 30 Mg Tabs (Paroxetine hcl) .Marland Kitchen.. 1 two times a day 3)  Losartan Potassium 100 Mg Tabs (Losartan potassium) .Marland Kitchen.. 1 by mouth once daily 4)  Fiber  .... 2 tabs daily 5)  Vit C 1000mg   6)  Aspirin Adult Low Strength 81 Mg Tbec (Aspirin) .Marland Kitchen.. 1 by mouth once daily 7)  Nitro-dur 0.4 Mg/hr Pt24 (Nitroglycerin) .... As needed 8)  Carvedilol 25 Mg Tabs (Carvedilol) .Marland Kitchen.. 1 by mouth two times a day 9)  Effient 10 Mg Tabs (Prasugrel hcl) .Marland Kitchen.. 1 by mouth once daily 10)  Prilosec Otc 20 Mg Tbec (Omeprazole magnesium) .Marland Kitchen.. 1 by mouth  two times a day pre meals 11)  Ranexa 500 Mg Xr12h-tab (Ranolazine) .Marland Kitchen.. 1 by mouth two times a day 12)  Trilipix 135 Mg Cpdr (Choline fenofibrate) .Marland Kitchen.. 1 each am **appointment due now** 13)  Onetouch Ultra Test Strp (Glucose blood) .... Onetouch ultra2 test strips dx: 790.29, check bloodsugar once daily 14)  Crestor 40 Mg Tabs (Rosuvastatin calcium) .Marland Kitchen.. 1 by mouth at bedtime 15)  Advair Diskus 100-50 Mcg/dose Aepb (Fluticasone-salmeterol) .Marland Kitchen.. 1 inhaaltion every 12 hrs ; gargle & spit after use  Patient Instructions: 1)  Chrondroitin 800 mg once daily for shouder untio seen by Physical Therapy. Prescriptions: ADVAIR DISKUS 100-50 MCG/DOSE AEPB (FLUTICASONE-SALMETEROL) 1 inhaaltion every 12 hrs ; gargle & spit after use  #1 x 0   Entered and Authorized by:   Marga Melnick MD   Signed by:   Marga Melnick MD on 08/16/2010   Method used:   Samples Given   RxID:   [id number]    Orders Added: 1)  Est. Patient Level IV [08657] 2)  Physical  Therapy Referral [PT] 3)  T-2 View CXR [71020TC] 4)  Misc. Referral [Misc. Ref]  Appended Document: cough/nta Note: no tenderness to palpation over clavicle

## 2010-08-29 ENCOUNTER — Encounter: Payer: Self-pay | Admitting: Internal Medicine

## 2010-08-30 ENCOUNTER — Encounter: Payer: Self-pay | Admitting: Internal Medicine

## 2010-09-01 NOTE — Assessment & Plan Note (Addendum)
Summary: per dr hopper/lpft/mhh--pt unable to do pft no charge//jwr   Allergies: 1)  ! Lipitor (Atorvastatin) 2)  ! Zocor 3)  ! Crestor (Rosuvastatin Calcium)   Other Orders: No Charge Patient Arrived (NCPA0) (NCPA0)   Orders Added: 1)  No Charge Patient Arrived (NCPA0) [NCPA0]    Patient was unable to produce Acceptable and Reproducible Spirometry data. Leak with lung volumes. Good pt effort. Due to patients cough was unable to obtain criteria needed for test. I spoke with Dr. Maple Hudson informing him of same and he suggested to administer bronchodilator and then allow patient to try test again. I administered bronchodilator per protocol, patient was unable to produce test, explaining once he was out of breath he would began to cough and an area on his abodomen from a past surgery caused pain once he starts to cough. Will inform Dr. Maple Hudson and Dr. Alwyn Ren of same. Zackery Barefoot CMA  August 26, 2010 5:02 PM

## 2010-09-09 NOTE — Miscellaneous (Signed)
----   Converted from flag ---- ---- 08/26/2010 5:07 PM, Bryan [name] CMA wrote: Patient arrived at Pulmonary today for PFT, pt was unable to produce same, noted in OV.   Thanks ------------------------------

## 2010-09-09 NOTE — Letter (Signed)
Summary: Patient Unable to Attend due to cost/[facility]   Patient Unable to Attend due to cost/[facility]   Imported By: Maryln Gottron 09/03/2010 10:35:47  _____________________________________________________________________  External Attachment:    Type:   Image     Comment:   External Document

## 2010-10-30 ENCOUNTER — Other Ambulatory Visit: Payer: Self-pay | Admitting: Internal Medicine

## 2010-12-07 NOTE — Discharge Summary (Signed)
Bryan [name], Bryan NO.:  [id number]   MEDICAL RECORD NO.:  [id number]          PATIENT TYPE:  INP   LOCATION:  2923                         FACILITY:  [facility]   PHYSICIAN:  Darlin Priestly, MD  DATE OF BIRTH:  Dec 24, 1940   DATE OF ADMISSION:  05/30/2007  DATE OF DISCHARGE:  06/01/2007                               DISCHARGE SUMMARY   DISCHARGE DIAGNOSES:  1. Unstable angina, catheterization this admission, plan is for      continued medical therapy, Ranexa was added.  2. Coronary disease, coronary artery bypass grafting in 1991, with      more than 20 catheterizations and percutaneous coronary      intervention since then.  3. Preserved left ventricular function.  4. History of hypertension, somewhat hypotensive this admission.  His      medications were cut back a little.  5. Renal insufficiency, his creatinine at discharge is 1.9, Benicar is      on hold.  6. Treated dyslipidemia.  7. History of gastroesophageal reflux disease.  8. Non-insulin-dependent diabetes.  9. Past history of anxiety.   HOSPITAL COURSE:  Bryan [name] is a 70 year old male followed by Dr.  Shelva Majestic.  He has had multiple catheterizations in the past.  His last  catheterization prior to this admission was in April 2007, and he had a  non-DES stent placed.  This was by Dr. Riley Kill to the RCA.  The patient  was admitted May 30, 2007, as a referral from Dr. Elyn Aquas office.  The patient wanted a different group to take care of him from now on.  He was seen by Dr. Jenne Campus.  Symptoms were worrisome for unstable angina  and he was admitted to telemetry, started on heparin and nitroglycerin.  His Benicar was held and he was hydrated.  He underwent diagnostic  catheterization on May 30, 2007.  This revealed patent SVG to RCA.  There were multiple PCI and stent sites, but there was no more than 60%  narrowing proximally.  The SVG to the ramus intermedius and OM were  patent.  The  LIMA to the LAD was patent.  EF was 60%.  Plan is for  continued medical therapy.  Ranexa was added.  The patient was mildly  hypotensive postprocedure and we kept him an extra day for ambulation.  His creatinine went to 1.9.  Dr. Alanda Amass feels he can be discharged.  He will need a followup BMP next week.  Will hold his Benicar for now  and we cut his Imdur back to 15 mg a day.   DISCHARGE MEDICATIONS:  1. Tricor 145 daily.  2. Plavix 75 mg a day.  3. Atenolol 25 mg a day.  4. Benicar 40 mg a day is on hold.  5. Imdur daily.  6. Paxil 15 mg b.i.d.  7. Simvastatin 20 mg a day.  8. Actos 45 mg 1/2 tablet a day.  9. Aspirin 325 mg a day.  10.Vitamin C.  11.Prilosec 20 mg a day.  12.Ranexa 500 mg a day.  13.Nitroglycerin sublingual p.r.n.   LABORATORY DATA AND X-RAY FINDINGS:  White count 7.1, hemoglobin 13.2,  hematocrit 38.3, platelets 221.  Sodium 140, potassium 4.1, BUN 30,  creatinine 1.9.  Liver functions were normal.  CK-MB and troponins were  negative.  TSH is 2.57.  D-dimer is less than 0.22.   Chest x-ray shows no acute abnormality.  Urinalysis is unremarkable.  EKG shows sinus rhythm, sinus bradycardia with nonspecific ST changes.   DISPOSITION:  The patient is discharged in stable condition and will  follow up with Dr. Shelva Majestic.  He will have a BMP next week.  His Benicar  is on hold.  His creatinine is 1.9 at discharge.      Abelino Derrick, P.A.      Darlin Priestly, MD  Electronically Signed    LKK/MEDQ  D:  06/01/2007  T:  06/02/2007  Job:  [id number]   cc:   Titus Dubin. Alwyn Ren, MD,FACP,FCCP  Macarthur Critchley Shelva Majestic, M.D.

## 2010-12-07 NOTE — Cardiovascular Report (Signed)
NAMEAH, BOTT NO.:  [id number]   MEDICAL RECORD NO.:  [id number]          PATIENT TYPE:  INP   LOCATION:  2907                         FACILITY:  [facility]   PHYSICIAN:  Darlin Priestly, MD  DATE OF BIRTH:  16-Feb-1941   DATE OF PROCEDURE:  05/30/2007  DATE OF DISCHARGE:                            CARDIAC CATHETERIZATION   PROCEDURE:  1. Left heart catheterization.  2. Coronary angiography.  3. Left ventriculography.  4. Saphenous vein graft angiography.  5. Left internal mammary angiography.   COMPLICATIONS:  None.   INDICATIONS:  Mr. Levene is a 70 year old male patient of Dr. Harland Dingwall who has undergone multiple cardiac catheterizations, totally  approximately 26 to date, with a history of coronary bypass surgery in  1991 consisting of a LIMA to LAD, sequential vein graft to OM1 and OM3,  and vein graft to distal PDA.  He has undergone multiple percutaneous  interventions to the vein graft to the RCA.  He presented to Dr.  Elyn Aquas office today with 3 days of stuttering chest pain, thought he  had negative cardiac enzymes with no significant EKG changes in the ER.  He continue to have chest pain through the day, despite IV  nitroglycerin, heparin and morphine.  He is now brought to the cardiac  cath lab to redefine his coronary anatomy.   DESCRIPTION OF PROCEDURE:  After obtained informed consent, the patient  was brought to the cardiac cath lab.  The right and left groin were  shaved, prepped and draped in  the usual sterile fashion.  Usual  monitoring established.  Using the modified Seldinger technique, a 6-  French arterial sheath was placed in the right femoral artery.  A 6-  [place] diagnostic catheter was used to perform diagnostic angiography.   The left main is a large vessel with no significant disease.   LAD is totally occluded at its ostial portion.  The mid and distal LAD  fill via a LIMA which is nonselective but appears to  be widely patent.  There is mild scattered disease of the LAD beyond the IMA insertion,  though this appears to be noncritical, approximately 40 percent.  It  should be noted the left subclavian comes off in a tortuous fashion into  the nonselective LIMA angiogram.   The left circumflex is a small vessel which coursed to the AV groove and  is totally occluded.  It gave rise to one small obtuse marginal branch.  The AV circumflex was noted to have 70% lesions throughout its proximal  mid segment.  The first OM is again a small vessel, approximately a 1.5  to 2.0 vessel with diffuse 50% scattered disease.   There is a patent sequential vein graft __________  to the first obtuse  marginal and then extends onto the lower OM.  There is 10% ostial  lesion, a 40% lesion in the graft beyond the touchdown of the OM.   The first OM is a small vessel which bifurcates in its early segment.  There is mild diffuse 50% disease throughout the proximal midsegment.   The third  OM has no significant disease beyond the graft insertion.  This does retrograde fill a second OM obtuse marginal.   The right coronary is a small and nondominant vessel which is a totally  occluded in its proximal segment.  There is a vein graft which inserts  into a more distal portion of the RCA.  This graft has multiple  overlapping stents throughout the proximal mid and distal segment.  There is proximally 60-70% disease throughout the ostial and proximal  segment, though there is no pressure damping noted.  There is diffuse 20-  30% in-stent restenosis throughout the entire mid segment of the stent.  There is a 50% lesion in the more distal segment of graft between the  previously placed stents.  The distal overlapping stents appear to be  widely patent.  There does not appear to be any significant disease  beyond the graft insertion.   Left ventriculogram reveals a preserved EF of 60%.   HEMODYNAMICS:  Systemic arterial  pressure 113/61, LV system pressure  114/3, LVDP of 13.   CONCLUSION:  1. Significant three-vessel coronary artery disease.  2. Patent left internal mammary artery to the left anterior descending      with noncritical disease of the left anterior descending beyond the      IMA insertion.  3. Patent sequential vein graft to OM1 and OM3 with scattered      noncritical disease of the first OM beyond the touchdown.  4. Patent vein graft to the distal RCA with diffuse 60-70%      ostial/proximal graft disease and diffuse mild in-stent restenosis      in the mid segment with no significant disease beyond the graft      insertion.  5. Normal left ventricular systolic function.      Darlin Priestly, MD  Electronically Signed     RHM/MEDQ  D:  05/30/2007  T:  05/31/2007  Job:  [id number]   cc:   Macarthur Critchley. Shelva Majestic, M.D.

## 2010-12-07 NOTE — H&P (Signed)
NAME:  Bryan [name], Bryan [name] NO.:  [id number]   MEDICAL RECORD NO.:  [id number]          PATIENT TYPE:  EMS   LOCATION:  MAJO                         FACILITY:  [facility]   PHYSICIAN:  Abelino Derrick, P.A.   DATE OF BIRTH:  [birth year]   DATE OF ADMISSION:  05/30/2007  DATE OF DISCHARGE:                              HISTORY & PHYSICAL   CHIEF COMPLAINTS:  Chest and back pain.   HISTORY OF PRESENT ILLNESS:  Bryan [name] is a 70 year old male followed  by Dr. Shelva Majestic and Dr. Alwyn Ren with a history of coronary artery disease.  He has apparently had over 26 catheterizations and angioplasties.  He  had bypass surgery in 1991.  He has had subsequent interventions to the  vein graft to the RCA.  His last catheterization was in April 2007, when  he had a non- DES stent placed to the vein graft to the RCA.  He  apparently had  3 other non-DES stents in the RCA that were all patent.  At that time, he had a patent LIMA to the LAD, patent SVG to the  intermediate OM and normal LV function.  He had a negative nuclear study  in June 2008 that was negative for ischemia.  He continues to have chest  pain here in the ER but he has not taken nitroglycerin yet.  Even though  he says he was still having chest pain, he did stop for a cheeseburger  on the way over.   PAST MEDICAL HISTORY:  Remarkable for coronary artery bypass grafting in  1991.  He has had subsequent interventions with non-DES stents to the  RCA vein graft as noted.  He has non-insulin-dependent diabetes.  He has  chronic renal insufficiency, his last creatinine was 1.7.  He has peptic  ulcer disease and has been followed by [facility] for this.  Apparently  he has had diverticular disease as well and has had surgery in 2002 and  2005.  He had a ventral hernia repair in July of 2006.  He has had  multiple other surgeries, including a laparoscopic cholecystectomy,  appendectomy and a ventral hernia repair.  His last colonoscopy  and  endoscopy were in January 2007.   His current medications are aspirin 325 mg a day, Prilosec 20 mg a day,  Zocor 20 mg a day, atenolol 25 mg a day, Paxil 15 mg b.i.d., Benicar 40  mg a day, Actos 45 mg 1/2 tablet a day, Imdur 30 mg a day, Tricor 145  daily.   HE IS ALLERGIC TO LIPITOR.   SOCIAL HISTORY:  He is married.  He is an ex-smoker.   FAMILY HISTORY:  His father died at 53 of an MI.  He had a sister die in  her 36s of an MI and a brother died in his 15s of an MI.   REVIEW OF SYSTEMS:  Essentially unremarkable except for noted above.   PHYSICAL EXAMINATION:  VITAL SIGNS:  Blood pressure 130/62, pulse 58,  temperature 98.8.  GENERAL:  He is a well-developed, well-nourished male  in no acute distress.  HEENT:  Normocephalic.  Extraocular movements are intact.  Sclerae are  anicteric.  NECK:  Without bruit or JVD.  CHEST:  Clear to auscultation and percussion.  CARDIAC:  Exam reveals regular rate and rhythm without murmur,  rub or  gallop.  Normal S1 and S2.  ABDOMEN:  Reveals multiple surgical scars with soft swelling around the  umbilicus.  EXTREMITIES:  Reveal no edema.  Pulses are equal.  There are no bruits.  NEURO:  Exam grossly intact.   EKG shows sinus rhythm, sinus bradycardia without acute changes.   IMPRESSION:  1. Unstable angina.  2. Coronary artery bypass grafting in 1991 with multiple      catheterizations and percutaneous coronary interventions since.      Last intervention was April 2007.  He had a negative nuclear study      in June 2008.  3. Good left ventricular function.  4. Treated hypertension.  5. Treated dyslipidemia.  6. Gastroesophageal reflux.  7. Anxiety.   PLAN:  The patient to be admitted to telemetry.  We will start him on  heparin and nitro.  He will probably need another catheterization.  We  will document a D-dimer and get a CT of his chest if this is positive.      Abelino Derrick, P.ALenard Lance  D:  05/30/2007  T:   05/31/2007  Job:  [id number]

## 2010-12-10 NOTE — Discharge Summary (Signed)
NAME:  MUSTAFA, POTTS NO.:  [id number]   MEDICAL RECORD NO.:  [id number]                   PATIENT TYPE:  INP   LOCATION:  0472                                 FACILITY:  [facility]   PHYSICIAN:  Abigail Miyamoto, M.D.              DATE OF BIRTH:  [birth year]   DATE OF ADMISSION:  09/09/2003  DATE OF DISCHARGE:  09/15/2003                                 DISCHARGE SUMMARY   DISCHARGE DIAGNOSIS:  Diverticulitis.   SUMMARY OF HISTORY:  Mr. Glenard Keesling is a 70 year old gentleman who had  had sigmoid colectomy in the past for diverticulitis.  He, however, has had  a recurrent diverticulitis which showed on CAT scan several weeks ago.  He  presented to the emergency department with an elevated white blood count and  increasing abdominal pain and some nausea and chills.  He had been having  normal bowel movements.  On physical examination at the time of admission he  did have mild tenderness in his left lower quadrant.  At this point the plan  was to admit the patient to the hospital for IV antibiotics and CAT scan  performance.  After admission the patient was found to have a normal white  blood count of 9.5.  CAT scan of the abdomen and pelvis was performed which  showed him to have some mild sigmoid diverticulitis but no evidence of free  air, abscess, or bowel obstruction.  At this point his IV antibiotics were  continued and his diet was advanced.  Over the next several days he quickly  improved.  He did develop gout in his toe and was placed on Indocin for this  and he appeared to respond.  By hospital day #7 he appeared to be almost  totally resolved on IV antibiotics and at this point the decision was made  to discharge him home with plans for a formal resection in 2 weeks.  Discharge diagnosis again is sigmoid diverticulitis.   DISCHARGE MEDICATIONS:  1. He will continue his home medications including his Indocin for his gout.  2. He will take  Augmentin 875 mg twice a day for 7 days.   He has no restrictions regarding his activity or diet.   DISCHARGE FOLLOW-UP:  He will follow up in my office this Friday following  discharge to schedule his elective surgery.                                               Abigail Miyamoto, M.D.    DB/MEDQ  D:  09/23/2003  T:  09/23/2003  Job:  [id number]

## 2010-12-10 NOTE — Cardiovascular Report (Signed)
NAME:  Bryan [name], Bryan [name] NO.:  [id number]   MEDICAL RECORD NO.:  [id number]          PATIENT TYPE:  INP   LOCATION:  2807                         FACILITY:  [facility]   PHYSICIAN:  Arturo Morton. Riley Kill, M.D. [facility] OF BIRTH:  Nov 21, 1940   DATE OF PROCEDURE:  10/24/2005  DATE OF DISCHARGE:                              CARDIAC CATHETERIZATION   INDICATIONS:  Bryan [name] is well-known to [place].  He previously underwent  revascularization surgery in 1991.  He has not been restudied since 2004.  He now presents with recurrent chest pain.  He has not had the  electrocardiographic abnormalities, nor have the enzymes been positive.  There has been a slight increase in creatinine he has borderline diabetes.   PROCEDURE:  1.  Left heart catheterization  2.  Selective coronary arteriography.  3.  Selective left ventriculography.  4.  Saphenous vein graft angiography.  5.  Selective left internal mammary angiography.  6.  Percutaneous stenting of the distal right saphenous vein graft for      unstable angina.   DESCRIPTION OF PROCEDURE:  The patient was brought to the catheterization  laboratory, prepped and draped in the usual fashion.  Through an anterior  puncture, the right femoral artery was entered.  The patient had previously  received Lovenox.  Through an anterior puncture, a 4-French sheath was  initially placed.  Views of the left and right coronaries were obtained in  very limited projections to reduce contrast load.  A view of the vein graft  to the intermediate and OM was taken in 2 views.  Likewise, internal mammary  angiography was performed without difficulty.  Views of the right graft were  then obtained without difficulty.   Following this, preparations were made for percutaneous intervention.  Intravenous fluids were administered.  Based on the timing of the  enoxaparin, I elected to give bivalirudin.  The patient was given a small  load of Plavix.  We used a  right bypass catheter and upgraded the sheath to  a 6-French sheath.  A right bypass catheter was then placed in the right  coronary ostium.  A P[place]ater wire was placed down into the a retrograde  portion of the limb.  Predilatation was done distally with a 2.25 mm x 8  balloon.  We then dilated the two overlapping lesions in the distal stent  with a 3-mm balloon.  Following this, a 3 x 23 mm MULTI-LINK VISION stent  was then placed across the 2 stents that were on the edges of the previously  placed stent.  The stent was taken up to about 14 atmospheres.  There was  diminished flow across the distally dilated lesion; and we, therefore, put  another balloon down there and opened this up.  There was evidence that  there was a dissection at the distal insertion, and we were going to have to  stent across that.  We initially placed a 12-mm length stent, it did not  appear to be quite long enough; and a 15 mm x 2.25 VISION stent was then  laid across the insertion site  and telescoped proximally into the previously  placed stent.  This was taken up to about 10 atmospheres; the balloon was  pulled back slightly, and then taken up to about 15 atmospheres as this was  overlap area.   We then put a 2.75-mm balloon, Quantum Maverick, and then dilated this along  the overlap areas of the stent and more proximally.  A 3.25-mm Quantum  Maverick balloon was then also placed and the stent post dilated as well.  There appeared to be excellent transition between the two stents into the  posterolateral branch; and there was excellent TIMI 3 flow at the completion  of the case.  The result was classified as successful.   HEMODYNAMIC DATA:  1.  Central aortic pressure 108/65, mean 84.  2.  Left ventricular pressure 105/12.  3.  No gradient or pullback across aortic valve.   ANGIOGRAPHIC DATA.:  1.  A ventriculography was done in the RAO projection.  There was preserved      overall LV function with mild  inferobasal hypokinesis.  2.  The left main is very short.  It tapers distally about 30%, and then the      proximal LAD is totally occluded.  The circumflex consists of a small      marginal branch and then the circumflex, itself, is occluded right after      this.  3.  The left internal mammary to the distal LAD is widely patent.      Importantly, there is a fair amount of diffuse irregularity of the      distal LAD, but without critical stenosis.  4.  There is a sequential vein graft that goes to the intermediate and to      the circ marginal.  Importantly, the intermediate has been previously      dilated at the insertion and remains without significant restenosis.      There is a fair amount of diffuse luminal irregularity in the      intermediate that divides provides twice but no high-grade stenosis.      There is 30% narrowing of the vein graft just after the first touchdown,      but the vein graft is widely patent into the posterolateral; which,      itself, is widely patent.  5.  The right coronary is totally occluded proximally.  6.  The vein graft to the distal right coronary demonstrates a patent stent      at the ostium which was placed in 2004.  There is about 30-40% narrowing      in this stent site.  There is a mid vein graft stent which has about 20-      30% narrowing proximal to the stent; and there is not a lot of diffuse      InStent restenosis.  Distally there is a previously placed stent which      is also patent, but there is 90% narrowing just proximal to the stent,      and 90% narrowing distal to the stent.  In the middle of the stent is      the retrograde filling of the posterior descending branch.  Beyond this      area, there is a 90% stenosis at the insertion in to the distal      circumflex which it covers.  Following stenting of this entire area, all     of this area is reduced to 0% residual luminal narrowing  across these      three 90% lesions with  excellent TIMI 3 flow runoff and flow.  There      were no complications   CONCLUSION:  1.  Preserved overall LV function.  2.  Continued patency internal mammary to the LAD.  3.  Continued patency of saphenous vein graft to the intermediate      posterolateral.  4.  A continued patency of saphenous vein graft to the distal right, but      with 3 new lesions; 2 on either side of the distal stent, and 1 at the      distal insertion with successful percutaneous stenting with overlapping      non-DES stents.   DISCUSSION:  We elected to use non-DES stents for the following reasons:  The patient has had multiple stent procedures in the past; and has not had  significant restenosis in these areas of stenting.  The distal vessel is  relatively  small.  The runoff in the posterolateral branch is really quite good.  There  is also fairly vigorous filling of the posterior descending branch in a  retrograde fashion.  The entire angiographic result was excellent.  The  patient will follow up with Dr. Shelva Majestic.      Arturo Morton. Riley Kill, M.D. [facility]  Electronically Signed     TDS/MEDQ  D:  10/24/2005  T:  10/24/2005  Job:  [id number]   cc:   Macarthur Critchley. Shelva Majestic, M.D.  Fax: [phone]   CV Laboratory   Titus Dubin. Alwyn Ren, M.D. [facility]  [phone] [place]  [place] [address]

## 2010-12-10 NOTE — Discharge Summary (Signed)
NAME:  Bryan [name], Bryan [name] NO.:  [id number]   MEDICAL RECORD NO.:  [id number]                   PATIENT TYPE:  INP   LOCATION:  6734                                 FACILITY:  [facility]   PHYSICIAN:  Abigail Miyamoto, M.D.              DATE OF BIRTH:  07/26/1940   DATE OF ADMISSION:  10/10/2003  DATE OF DISCHARGE:  10/15/2003                                 DISCHARGE SUMMARY   DISCHARGE DIAGNOSES:  1. Recurrent sigmoid diverticulitis, status post extended left hemicolectomy     and takedown of splenic flexure.  2. History of coronary artery disease.  3. Anxiety.  4. Hypertension.   HISTORY:  Bryan [name] is a 70 year old gentleman who had had a limited  sigmoid colectomy in the past for diverticulitis.  He now presents for  recurrent diverticulitis and therefore plans for a left partial colectomy.   HOSPITAL COURSE:  The patient was admitted on October 10, 2003 and taken to  the operating room, where he underwent a left partial colectomy with  takedown of the splenic flexure.  He tolerated the procedure well and was  taken in stable condition to a regular surgical floor.  On postoperative day  1, he had some mild hypotension but responded to bolused IV fluids.  His  hemoglobin and hematocrit were stable at this time.  On postoperative day 2,  he was a little confused and was having some fever and he was started back  on his anxiolytic medications and pulmonary toilet was instituted.  On  postoperative day 3, he was still partly confused and was otherwise doing  well.  His temperatures were coming down.  His white blood count at this  time was 11.6 with a hemoglobin of 9.9 and creatinine of 1.5.  Because of  his mild hypertension, he was started back on his home medications and he  was allowed sips of liquids and his Foley catheter was removed.  On  postoperative day 4, he was ambulating, he was no longer confused, his  abdomen was soft with bowel sounds,  his ileus had resolved and his IV fluids  were decreased.  By postoperative day 5, he was having flatus and moving his  bowels; he was questionably mildly distended; his ileus was, however,  resolving.  At this point, he requested to be discharged from the hospital  and so after discussion with him, we decided to let him go home on a liquid  diet.   DISCHARGE DIET:  He will stay on clear liquids until he is moving his bowels  better, then he will advance his diet as tolerated.   DISCHARGE MEDICATIONS:  He will take Percocet or Vicodin for pain as well as  Advil.  He will resume his home medications.   DISCHARGE ACTIVITY:  He should do no heavy lifting greater than 20 pounds  for the next 6 weeks.  He may shower.  DISCHARGE FOLLOWUP:  He will follow up in my office next week for a wound  check and staple removal.                                                Abigail Miyamoto, M.D.   DB/MEDQ  D:  11/25/2003  T:  11/26/2003  Job:  [id number]

## 2010-12-10 NOTE — Consult Note (Signed)
NAME:  COLESON, KANT NO.:  [id number]   MEDICAL RECORD NO.:  [id number]          PATIENT TYPE:  INP   LOCATION:  2918                         FACILITY:  [facility]   PHYSICIAN:  Vida Roller, M.D.   DATE OF BIRTH:  1941-06-29   DATE OF CONSULTATION:  10/21/2005  DATE OF DISCHARGE:                                   CONSULTATION   PRIMARY CARE PHYSICIAN:  Titus Dubin. Alwyn Ren, M.D.   CARDIOLOGIST:  Macarthur Critchley. Torelli, M.D. in [facility].  Previous cardiologist  had been Dr. Bonnee Quin.   HISTORY OF PRESENT ILLNESS:  Mr. Bryan [name] is a 70 year old man with an  extensive history of coronary artery disease who presents with approximately  two to three days of discomfort in his chest.  It started when he got. He  was walking around the house and started to have some chest pain which  increased.  It was associated with some left lateral discomfort in his  chest.  No nausea, vomiting, diuresis.  He did have some shortness of  breath.  Chest pain was associated with some exertional activities and when  he lies down the chest pain goes away.  He is able to sleep at night.  He  denies any other associated symptoms and is currently without discomfort.   PAST MEDICAL HISTORY:  1.  Significant for coronary disease.  He is status post bypass surgery in      1991 with a LIMA to his LAD, saphenous vein graft to the right coronary      artery, a saphenous vein graft to the posterior descending coronary      artery.  He had a heart catheterization in August of 2004 with a bare-      metal stent to the saphenous vein graft to the right coronary artery.      He has an occluded LAD proximally, severe proximal disease in the      circumflex distribution, an occluded dominant circumflex, widely patent      LIMA to the LAD and a patent saphenous vein graft to the first diagonal.      His ejection fraction at that time was 50%.  He subsequently had a      perfusion study which shows trivial  apical ischemia and an ejection      fraction of 66% and that was completed in September of 2004.  2.  He has an anxiety disorder.  3.  Hyperlipidemia.  4.  Hypertension.  5.  Gastroesophageal reflux disease.  6.  Diverticulitis.  7.  Uncontrolled diabetes.  8.  History of nephrolithiasis.  9.  He has had a laparoscopic cholecystectomy, appendectomy.  10. Foot surgery on two different occasions.  11. The bypass surgery.  12. A partial colectomy in 2002.  13. Eye surgery with implants.   SOCIAL HISTORY:  He lives in [place] with his spouse.  He is a Pharmacist, community.  He has two stepchildren.  He used to smoke but he quit a number  of years ago.  He denies any alcohol or drugs.  His mother died  of a CVA and  cancer.  Father died of a heart attack at age 10.  He has one sister who  died of a heart attack at age 51.  One brother who died of a heart attack at  85.  One brother who died of a heart attack at age 36.  He has one brother  who is alive at age 54 but had bypass surgery.   CURRENT MEDICATIONS:  1.  Lovenox subcutaneous b.i.d.  2.  Protonix 40 mg once a day.  3.  Atenolol 25 mg a day.  4.  Benicar 20 mg a day.  5.  Paxil 15 mg a day.  6.  Zocor 20 mg a day.  7.  Morphine sulfate p.r.n. chest discomfort.   REVIEW OF SYSTEMS:  He denies any fever, chills, or sweats.  No headaches,  sinus tenderness, nose bleeds, voice changes, vertigo, photophobia, or  vision loss.  No problems with his dentition.  His chest discomfort is  absent currently.  No shortness of breath, dyspnea on exertion or PND.  No  presyncope or syncope.  No claudication, cough or wheezing.  He does have no  frequency or urgency.  Occasionally, he will have hematuria which he feels  is due to his kidney stones.  He has had none recently.  No weakness,  numbness, no fatigue.  No nausea, vomiting, diarrhea, bright red blood per  rectum, melena or hematochezia.  The remainder of his review of systems is   negative.   PHYSICAL EXAMINATION:  He is a well-developed, well-nourished white male in  no apparent distress who is relatively unpleasant and a difficult historian.  His pulse is 61.  His blood pressure is 132/74, respiratory rate is 18.  He  is saturating 95% on room air.  Examination of the head, ears, eyes, nose  and throat is unremarkable.  The neck is supple.  There is no jugular venous  distention or carotid bruits.  His chest is clear to auscultation  bilaterally.  His cardiovascular exam is regular with a normal first and  second heart sound.  Point of maximum impulse is not palpable.  There are no  murmurs noted.  His abdomen is soft and nontender with normal active bowel  sounds.  He has well healed median sternotomy scar and abdominal surgical  scars.  His breasts, GU and rectal exam were deferred.  His extremities were  without clubbing, cyanosis, or edema.  His neurologic exam was grossly  nonfocal.   Chest x-ray is pending.  Electrocardiogram shows sinus rhythm at a rage 61  with poor R-wave progression, mild intraventricular conduction delay with a  QRS duration of 108 msec.  PR and QT intervals are normal.  Heart rate is 61  with no ischemic ST or T-wave changes noted.   LABORATORY DATA:  White blood cell count is 8.9, H&H of 12 and 36, platelet  count 252.  Sodium 139, potassium 4.0, chloride 106, bicarbonate 25, BUN 17,  creatinine 1.4.  His blood sugar is 84.  First set of cardiac enzymes do not  show any significant abnormality.   IMPRESSION:  So this is a gentleman with angina. The angina is difficult to  quantify as the patient is an extremely difficult historian but it appears  that this angina is pretty much chronic and stable.  This recent episode of  angina appears to be more than he is usually used to and he sought care with  Dr. Alwyn Ren because  his cardiologist, Dr. Shelva Majestic, was not available.  His initial enzymes and electrocardiogram are reassuring and his  hypertension  appears to be under reasonably good control.  His hyperlipidemia is okay.  We are going to check a set of fasting lipids and his diabetes is under the  care of Dr. Alwyn Ren.   I must say this is an extremely difficult patient.  He is very unpleasant.  We attempted to address his issues.  He is pretty much angry because he did  not get catheterized today. It is Friday afternoon and unfortunately he will  probably have to wait over the weekend to get evaluated.  He is unwilling  initially to that, and after we discussed it with him at length, he has  acquiesced to staying overnight to insure that he has not had a myocardial  infarction and  if his cardiac enzymes and electrocardiogram look okay in the morning then  he would like to go home.  I think this is not an unreasonable desire given  the set of circumstances and certainly we would be happy to arrange for him  to have an outpatient assessment at that point.      Vida Roller, M.D.  Electronically Signed     JH/MEDQ  D:  10/21/2005  T:  10/24/2005  Job:  [id number]   cc:   Titus Dubin. Alwyn Ren, M.D. [facility]  [phone]  [place] [address]   Macarthur Critchley. Shelva Majestic, M.D.  Fax: [phone]   Arturo Morton. Riley Kill, M.D. [facility]  [address]  [address]  [place]  [place] [address]

## 2010-12-10 NOTE — H&P (Signed)
[place]  Patient:    Bryan [name], CYGAN Visit Number: [id number] MRN: [id number]          Service Type: MED Location: [phone] Attending Physician:  Ronaldo Miyamoto Dictated by:   Nathen May, M.D. Admit Date:  01/09/2002   CC:         Macarthur Critchley. Shelva Majestic, M.D.   History and Physical  HISTORY OF PRESENT ILLNESS:  Mr. Kellogg is a 70 year old gentleman admitted with chest pain.  He is a 70 year old gentleman with a history of coronary artery disease, status post CABG, the date for which I do not have immediately available, status post bypass surgery in 1991 and with a history of 18 catheterizations between 1990 and last August.  He has had problems with persistent chest pain which have resulted in disability and most recently went intervention in February of 2002 where he had two stents placed to two grafts, I think the posterior descending and the distal circumflex.  His LAD is moderately diseased distal to the anastomosis and his vein grafts to his diagonal and obtuse marginal are apparently patent.  He had recurrent chest pain.  In August of 2002 he underwent catheterization which demonstrated that these grafts were still patent and there is slight progression in the small perforator.  Because of ongoing chest pain, he underwent a second course of EECP which he just finished actually today.  Because of problems with unrelenting chest pain, catheterization was scheduled for Friday but because of recurrent pain tonight that was refractory therapy, he presented to hospital for admission.  MEDICATIONS: 1. Imdur. 2. Allopurinol. 3. Paxil 20. 4. Atenolol 50. 5. Zocor 20. 6. Aspirin. 7. Vitamin C.  ALLERGIES:  No known drug allergies.  PAST MEDICAL HISTORY: 1. In addition to the above is notable for gout. 2. Depression.  SOCIAL HISTORY:  He lives in [place] with his wife.  He is retired. Because of his  health issues there is no abuse of cigarettes, alcohol, or recreational drugs.  ASSESSMENT:  Notable primarily for above.  He also has exercise intolerance associated with his chest discomfort.  PHYSICAL EXAMINATION:  GENERAL:  He is a middle-aged to older Caucasian male in no acute distress.  VITAL SIGNS:  His blood pressure is 134/64, his pulse is 58.  He is 250 some odd pounds at 5 feet 11 inches tall.  He has a waist of 40 inches.  HEENT:  Demonstrates no scleral icterus, no xanthomata.  NECK:  Neck veins were flat.  His carotids were brisk bilaterally without bruits.  There is no lymphadenopathy appreciated.  BACK:  Was without kyphosis, scoliosis.  LUNGS:  Clear.  HEART:  Heart sounds were regular.  There is an early systolic murmur.  ABDOMEN:  Soft with active bowel sounds without midline pulsation or hepatomegaly.  Femoral pulses were 2+.  EXTREMITIES:  Distal pulses were intact.  There is no clubbing, cyanosis, or edema.  NEUROLOGIC:  Grossly normal.  LABORATORY:  Electrocardiogram dated today demonstrated sinus rhythm at 56 with interval of 0.15/0.10/0.42.  There is no significant ST/T changes.  There was an R prime in lead V1.  IMPRESSION: 1. Chest pain consistent with prior episodes of angina - progressive. 2. Coronary artery disease.    a. Status post coronary artery bypass grafting in 1991.    b. Status post PCI of two vein grafts in February 2002 with a       catheterization in August 2002 showing  insignificant progression. 3. Status post two courses of EECP, just having finished #2.  The question    ejection fraction. 4. Hyperlipidemia with labs in August 2002 showing persistent elevations of    his LDL and depression of his HDL for abdominal obesity with progressive    weight gain. 5. Depression.  PLAN: 1. Mr. Gras has recurrent and persistent problems with chest discomfort.    We will check his enzymes to see if there is any evidence of acute     myocardial injury. 2. We will treat him for his acute coronary syndrome with Lovenox, ongoing    aspirin, and intravenous nitroglycerin therapy. 3. Cath taken as already scheduled. 4. Will recheck his fasting lipid profile and consider additional therapy if    his HDL remains low and his LDL remains high. 5. Encourage weight loss. Dictated by:   Nathen May, M.D. Attending Physician:  Ronaldo Miyamoto DD:  01/09/02 TD:  01/10/02 Job: [id number]

## 2010-12-10 NOTE — Op Note (Signed)
[place]  Patient:    Bryan [name], Bryan [name] Visit Number: [id number] MRN: [id number]          Service Type: MED Location: [phone] Attending Physician:  Thermon Leyland Dictated by:   Lucrezia Starch. Ovidio Hanger, M.D. Proc. Date: 07/19/01 Admit Date:  07/18/2001                             Operative Report  PREOPERATIVE DIAGNOSIS:  Left ureterolithiasis with pain.  POSTOPERATIVE DIAGNOSIS:  Left ureterolithiasis with pain.  OPERATION PERFORMED:  Cystourethroscopy, left retrograde ureteropyelogram and placement of left double-J stent and left renal pelvic urine culture and sensitivity.  SURGEON:  Lucrezia Starch. Ovidio Hanger, M.D.  ANESTHESIA:  General endotracheal.  ESTIMATED BLOOD LOSS:  Negligible.  TUBES:  26 cm 6 [place] Surgitek double pigtail stent.  COMPLICATIONS:  None.  INDICATIONS FOR PROCEDURE:  The patient is a very nice 70 year old white male who presented last night with severe left flank pain, nausea and vomiting.  He underwent a CT scan and KUB with revealed left hydronephrosis and a probable 4 mm left upper ureteral stone.  Despite medication, he has continued to have pain.  He has had low-grade fever and after understanding risks, benefits and alternatives has elected to proceed with the above procedure.  DESCRIPTION OF PROCEDURE:  The patient was placed in supine position.  After proper general endotracheal anesthesia, he was placed in the dorsal lithotomy position and prepped and draped with Betadine in sterile fashion. Cystourethroscopy was performed with a 22.5 French Olympus panendoscope utilizing the 12 and 70 degree lenses.  The bladder was carefully inspected. He was noted to have moderate trilobar hypertrophy, grade 1 to 2 trabeculation and efflux of clear urine from the right side.  The left ureteral orifice appeared to be somewhat inflamed.  Under fluoroscopy guidance, initial attempt to place a 0.038 French Teflon  coated guide wire were unsuccessful.  Therefore a 0.038 French glide wire was placed through a 6 [place] open ended catheter and a 6 [place] open ended catheter was placed into the left renal pelvis. Hydronephrotic drip was noted.  Blood tinged urine was obtained and sent for culture and sensitivity.  A 0.038 French Teflon coated guide wire was then placed in the left renal pelvis after dye had been injected and it was noted to be somewhat hydronephrotic.  Under fluoroscopic guidance a 26 cm 6 [place] Surgitek double pigtail stent was placed with a pullout string and noted to be in good position in the left renal pelvis and within the bladder. Reinspection revealed the stent was well in the bladder.  The bladder was drained.  The panendoscope was removed.  The string was taped to the penis and on fluoroscopy the stent was noted to be in excellent position within the left renal pelvis and within the bladder.  The stone could not be really visualized but a hydronephrotic drip had been noted cystoscopically.  The patient tolerated the procedure well.  No complications.  The patient was taken to the recovery room in stable condition. Dictated by:   Lucrezia Starch. Ovidio Hanger, M.D. Attending Physician:  Thermon Leyland DD:  07/19/01 TD:  07/20/01 Job: [id number]

## 2010-12-10 NOTE — H&P (Signed)
NAME:  Bryan [name], Bryan [name] NO.:  [id number]   MEDICAL RECORD NO.:  [id number]                   PATIENT TYPE:  EMS   LOCATION:  MAJO                                 FACILITY:  [facility]   PHYSICIAN:  Rollene Rotunda, M.D.                DATE OF BIRTH:  02-12-1941   DATE OF ADMISSION:  04/22/2003  DATE OF DISCHARGE:                                HISTORY & PHYSICAL   REASON FOR PRESENTATION:  Evaluate patient with chest pain.   HISTORY OF PRESENT ILLNESS:  The patient is a pleasant 70 year old gentleman  with a long cardiac history including angioplasty starting in 1990, and five  vessel CABG in 1991.  He reports 21 cardiac catheterizations.  The last of  these was in August of 2004.  It is detailed below.  At that time, he did  have stenting of a high-grade lesion and a vein graft to his posterior  descending artery.  He reported at that time that this did improve his chest  pain symptoms.  He says that on most of the 21 occasions of catheterization,  he has required intervention for improvement of his symptoms (I only have  one record, and that is from his last hospitalization).  He says that on  occasion he has been hospitalized and ruled out and managed medically,  although this is uncommon.  He has been followed by Dr. Shelva Majestic in the  office, and has been treated with two complete courses of EECP with  improvement in the past.  Typically, he manages episodes of chest pain with  sublingual nitroglycerin or occasional Imdur.   At 4 p.m. today, the patient developed severe left-sided chest pain  radiating to his left arm and shoulder.  This was 7/10 in intensity.  He did  take 30 mg of Imdur, followed 30 minutes later by another 30 mg with slight  improvement.  He felt somewhat nauseated.  He felt dizzy and diaphoretic.  These were all symptoms similar to his previous chest pain.  He presented to  the emergency room where he has been treated with IV nitrates  with some mild  improvement, though it was still 4/10 in intensity.  He has been quite  anxious, and has received his Paxil.   Prior to this, the patient said he had been doing his usual activity, which  includes a riding lawnmower and a little bit of trimming around the yard, as  well as occasional vacuuming.  He has not been able to bring on any  symptoms.  He described no severe shortness of breath, PND, or orthopnea.  He has not described any palpitations, presyncope, or syncope.   PAST MEDICAL HISTORY:  Coronary artery disease (catheterization in August  2004) which demonstrated left main mild disease, LAD occluded proximally,  the first diagonal fills via saphenous vein graft, distal LAD fills via  LIMA, left circumflex is occluded in  mid-segment, OM-2 fills via saphenous  vein graft.  The right coronary artery is occluded in the mid-segment with  90% distal.  The PDA filled via vein graft.  The LIMA to the LAD was patent.  The saphenous vein graft of the first diagonal and OM-2 was patent.  Saphenous vein graft to the PDA had 80% proximal stenosis, and a stented  patent mid-segment.  His ejection fraction was 50%.  He did have stenting x2  of the proximal vein graft in the SPIDER trial, depression, hyperlipidemia,  cataracts, history of seizures, gastroesophageal reflux disease, gout.   PAST SURGICAL HISTORY:  1. Appendectomy/cholecystectomy.  2. Colectomy for perforated colon.  3. Bilateral lens implants.   ALLERGIES:  None.   MEDICATIONS:  1. Zocor 20 mg q.h.s.  2. Atenolol 50 mg daily.  3. Plavix 75 mg daily.  4. Folic acid.  5. Paxil 30 mg daily.  6. Prevacid 30 mg daily.  7. Ecotrin 325 mg daily.   SOCIAL HISTORY:  He lives in [place] with his wife of 24 years.  He is a  retired Printmaker.  He has no children.  He does not smoke cigarettes or  drink alcohol.   FAMILY HISTORY:  Very positive for early strong coronary disease with his  brother dying at age 10  of an MI, another brother dying at age 53 of an MI,  a sister dying of complications from bypass.   REVIEW OF SYSTEMS:  As stated in the HPI, and positive for problems with is  equilibrium, claustrophobia, joint pain in his ankles, nocturia.  Otherwise  negative for other systems.   PHYSICAL EXAMINATION:  GENERAL:  The patient is anxious, but in no distress.  VITAL SIGNS:  Blood pressure 124/73, heart rate 63 and regular, afebrile.  HEENT:  Eyes unremarkable.  Pupils equal, round and reactive to light.  Fundi not visualized.  Oral mucosa unremarkable.  NECK:  No jugular venous distention.  Waveform within normal limits.  Carotid upstroke brisk and symmetric.  No bruits, thyromegaly.  LYMPHATICS:  No cervical, axillary, or inguinal adenopathy.  LUNGS:  Clear to auscultation bilaterally.  BACK:  No costovertebral angle tenderness.  CHEST:  Well-healed sternotomy scar.  HEART:  PMI not displaced or sustained.  S1 and S2 within normal limits.  No  S3, no S4, no murmurs.  ABDOMEN:  Obese.  Well-healed surgical scar.  Positive bowel sounds, normal  in frequency and pitch.  No bruits, rebound, or guarding.  No mass,  thyromegaly, or splenomegaly.  SKIN:  No rashes.  No nodules.  EXTREMITIES:  There were 2+ pulses throughout.  No edema.  NEUROLOGIC:  Oriented to person, place, and time.  Cranial nerves II-XII are  grossly intact.  Motor grossly intact.   EKG revealed sinus bradycardia, rate 58, axis within normal limits,  intervals within normal limits.  No acute ST-T wave changes.   LABORATORY DATA:  Sodium 140, potassium 3.7, chloride 106, BUN 15,  creatinine 1.4, AST 31, ALT 34, alkaline phosphatase 67, CK-MB 1.5, troponin  less than 0.05, INR 1.0.  WBC 8.4, hemoglobin 13.9, platelets 196,000.   ASSESSMENT AND PLAN:  1. Chest pain.  The patient presents with his usual chest pain, but without    objective evidence of ischemia.  The picture is quite complex.  He has     had CABG and 21  catheterizations.  His pain at last presentation did go     away after PCI, and has on multiple occasions  in the past.  Therefore, we     need to consider this similar presentation to be a high-grade lesion     until proven otherwise.  He will be admitted with heparin, nitroglycerin,     aspirin, and Plavix.  I will put him on the catheterization board for     elective cardiac catheterization.  If the pain does improve easily, and     there is no recurrence off of medications and with ambulation, and no     evidence of EKG changes or enzyme elevations, we could consider medical     management with risk stratification using a stress perfusion study.  2. Anxiety.  The patient reports that he will become quite anxious if not     given his usual medications.  He requests something for sleep, and he     will be accommodated.  3. Risk reduction.  He will continue Zocor and have a lipid profile.                                                Rollene Rotunda, M.D.    JH/MEDQ  D:  04/23/2003  T:  04/23/2003  Job:  [id number]   cc:   Titus Dubin. Alwyn Ren, M.D. [facility]   Macarthur Critchley. Shelva Majestic, M.D.  [address] [place]  [address]  [place]  [place] [address]  Fax: [phone]

## 2010-12-10 NOTE — Cardiovascular Report (Signed)
NAME:  Bryan [name], Bryan [name] NO.:  [id number]   MEDICAL RECORD NO.:  [id number]                   PATIENT TYPE:  INP   LOCATION:  2905                                 FACILITY:  [facility]   PHYSICIAN:  Veneda Melter, M.D.                   DATE OF BIRTH:  11/18/1940   DATE OF PROCEDURE:  03/03/2003  DATE OF DISCHARGE:  03/04/2003                              CARDIAC CATHETERIZATION   PROCEDURES PERFORMED:  Percutaneous transluminal coronary angioplasty and  stent placement in the proximal segment of the saphenous vein graft in the  right coronary artery utilizing a filter wire distal protection.   DIAGNOSES:  1. Coronary atherosclerotic disease.  2. 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  3. Unstable angina.   HISTORY:  Bryan [name] is a 70 year old white male with history of coronary  disease who presents with unstable angina.  The patient underwent cardiac  catheterization earlier today showing severe narrowing of 80% in the  proximal segment of the saphenous vein graft to the right coronary artery.  He presents now for percutaneous intervention.   TECHNIQUE:  Informed consent was obtained.  Existing 6 French sheath in the  right groin was utilized.  This was exchanged for a new sheath after 1%  Xylocaine was used for local infiltration.  The patient had been pretreated  with Plavix 600 mg earlier today and was given heparin to maintain ACT  greater than 250 seconds.  He was enrolled in the spider trial and  randomized to filter wire distal protection.  A 6 [place] JR-4 guide catheter  was then used to engage the vein graft to the right coronary artery and  selective guide shots obtained. This confirmed the presence of high grade  narrowing of 80% with slight haziness in the proximal segment of the vein  graft.  There was long segment of stent in the mid section that was patent  with mild in-stent restenosis of 20-30%.  A further stent in the  distal RCA  was also patent.  The filter wire distal protection device was introduced  and carefully positioned distally proximal to the distal RCA stent and  deployed.  Manual injection contrast in two views confirmed full apposition  with the vessel wall.  A 4.0 x 12-mm Express Two stent was then introduced  and carefully positioned in the proximal segment of the vein graft and  deployed at 12 atmospheres for 30 seconds.  There was modest residual  narrowing at the site of the severe lesion of 30% and a 4.5 x 8-mm Quantum  Maverick balloon was introduced and used to close the stent.  Three  inflations performed at 14 atmospheres for 30 seconds each in the distal and  proximal segments respectively with repeat angiography showing excellent  result with full apposition of the stent.  Unfortunately, there was a slight  haziness distal to the stent suggestive  of disruptive residual disease  versus balloon damage.  Regardless, we elected to cover this with an  additional stent and a 3.5 x 8-mm Express Two stent was introduced and  positioned just distal to the previously placed stent to cover this new  lesion and deployed at 16 atmospheres for 30 seconds.  The stent delivery  system was retracted slightly and used for dilate the junction of the two  stents at 16 atmospheres for 30 seconds.  Repeat angiography was then  performed showing full coverage of the lesion.  No residual vessel damage.  There was slightly sluggish flow through the vein graft although no visible  debris was noted in the basket.  The filter wire was then retrieved without  incident and repeat angiography performed after administration of  intracoronary nitroglycerin.  This showed an excellent result and TIMI-3  flow through the vein graft.  No vessel damage at the filter site or  proximally.  No evidence of distal thromboembolic phenomena.  Final  angiography was performed in various projections confirming these  findings.  The guide catheter was removed and sheaths returned to position.  Manual  inspection of the filter basket showed large amount of debris.  Sheath was  then secured in position.  The patient transferred to the holding area in  stable condition.  He tolerated the procedure well.   FINAL RESULT:  Successful percutaneous stent placement to the proximal  segment of the saphenous vein graft to the right coronary artery with  reduction of 80% narrowing to 0% with placement of a 4.0 x 12-mm Express Two  stent followed by a 3.5 x 8-mm Express Two stent dilated using a 4.5 mm  Quantum Maverick balloon in the proximal segment.   ASSESSMENT/PLAN:  Bryan [name] is a 70 year old gentleman with advanced  coronary atherosclerotic disease and progressive disease in the saphenous  vein graft.  He has undergone placement of bare metal stent to the vein  graft and will be continued on Plavix for a minimum of one month time and  preferably indefinitely.                                                Veneda Melter, M.D.    Melton Alar  D:  03/03/2003  T:  03/04/2003  Job:  [id number]   cc:   Titus Dubin. Alwyn Ren, M.D. [facility]   Maisie Fus D. Riley Kill, M.D.   Macarthur Critchley Shelva Majestic, M.D.  [address]  [place]  [place] [address]  Fax: [phone]

## 2010-12-10 NOTE — Assessment & Plan Note (Signed)
[facility]                           GASTROENTEROLOGY OFFICE NOTE   NAME:[name], [name]                     MRN:          [id number]  DATE:05/04/2006                            DOB:          08-22-1940    REFERRING PHYSICIAN:  Titus Dubin. Hopper, MD,FACP,FCCP   REASON FOR REFERRAL:  Intermittent hematochezia, hemoccult positive stool,  and lower abdominal pain.   HISTORY OF PRESENT ILLNESS:  Mr. Bryan [name] has had recurrent problems with  hemoccult positive stool and small-volume hematochezia.  He underwent upper  endoscopy and colonoscopy in January 2007.  Nodular gastritis, mild  diverticulosis, small internal hemorrhoids, and a small colon polyp were  noted.  He was felt to have ongoing small-volume hematochezia related to  hemorrhoids, and hemoccult positive stool related to either his nodular  gastritis or hemorrhoids.  He relates recent problems with bilateral lower  quadrant abdominal pain, and this was treated with a course of antibiotics  by Dr. Alwyn Ren, which seemed to slightly improve his symptoms, but his  symptoms really do persist.  It is not clear that he had acute  diverticulitis by his history.  Colonoscopy earlier this year revealed only  very mild diverticulosis in a small segment of his sigmoid colon.  He has  had a history of kidney stones, and the pain has radiated to his back, and  he is quite concerned about a recurrent kidney stone.  He relates that his  rectal bleeding generally follows a hard stool or recently followed loose  stools which occurred while he was on antibiotics but have now normalized.  He has not followed a high-fiber diet as we have previously recommended.  He  is not using any over-the-counter medications.  He notes no weight loss,  change in stool caliber, nausea, or vomiting.   CURRENT MEDICATIONS:  Listed on the chart and have been reviewed.   MEDICATION ALLERGIES:  None known.   PHYSICAL  EXAMINATION:  GENERAL:  Anxious white male.  No apparent distress.  VITAL SIGNS:  Weight 233, blood pressure 118/76, pulse 64 and regular.  CHEST:  Clear to auscultation bilaterally.  CARDIAC:  Regular rate and rhythm, without murmurs.  ABDOMEN:  Soft and nontender, with normoactive bowel sounds.  No palpable  organomegaly, masses, or hernias.  RECTAL:  Deferred, with recent exam performed at colonoscopy.   ASSESSMENT AND PLAN:  Persistent hemorrhoidal symptoms, with hemoccult  positive stool, likely related to hemorrhoids or nodular gastritis.  He is  to begin standard measures to treat internal hemorrhoids and diverticulosis,  including a long-term, high-fiber diet with daily fiber supplements to  achieve 20-25 g of dietary fiber.  He is to drink 6-8 glasses of water a  day, begin Anusol HC suppositories at bedtime.  He is to use this for 5-7  days after an episode of rectal bleeding.  If his abdominal pain persists,  we can consider proceeding with an abdominal and  pelvic CT scan.  He requests referral to Dr. Gaynelle Arabian to evaluate for  possible kidney stones, and we will schedule this referral for him.  Venita Lick. Russella Dar, MD, [facility]      MTS/MedQ  DD:  05/04/2006  DT:  05/05/2006  Job #:  [id number]   cc:   Titus Dubin. Alwyn Ren, MD,FACP,FCCP

## 2010-12-10 NOTE — Discharge Summary (Signed)
. [facility]  Patient:    Bryan [name], Bryan [name] Visit Number: [id number] MRN: [id number]          Service Type: MED Location: [phone] Attending Physician:  Ronaldo Miyamoto Dictated by:   Brita Romp, P.A.C. Admit Date:  01/09/2002 Discharge Date: 01/12/2002   CC:         Bryan [name]. Bryan [name], M.D. [facility]  Macarthur Critchley. Shelva Majestic, M.D.   Discharge Summary  DISCHARGE DIAGNOSES: 1. Coronary artery disease, status post cardiac catheterization this    admission. 2. Gout. 3. Hyperlipidemia. 4. Depression.  HOSPITAL COURSE:  [name] is a 70 year old male with a history of bypass surgery.  He also has had multiple cardiac catheterization interventions in the past decade.  He had been scheduled for an outpatient catheterization on June 20 for recurrent chest pain.  However, on June 18, the patient reported increased chest pain, minimal relief from sublingual nitroglycerin.  He was advised to come to the hospital for admission and pain relief.  The patient was seen and admitted by Nathen May, M.D., [facility]. Dr. Graciela Husbands ordered serial cardiac enzymes along with intravenous nitroglycerin and Lovenox.  He also planned for the patient to be catheterized as scheduled on June 20.  On June 20, the patient was taken to the catheterization lab by Bryan [name]. Riley Kill, M.D. [facility].  Catheterization revealed preserved left ventricular function.  Internal mammary artery was patent with a nonobstructive kink.  The saphenuos vein graft to the diagonal and distal circumflex/OM was patent. Saphenuos vein graft with two stents to a diffusely diseased right coronary artery was patent.  The native too small antral branch had a 70 to 75% stenosis. Dr. Riley Kill recommended continued medical therapy.  The next day, the patient reported no further chest pain, no shortness of breath.  He was felt to be stable for discharge.  DISCHARGE MEDICATIONS: 1. Imdur 60 mg  q.d. 2. Allopurinol 300 mg q.d. 3. Paxil 20 mg q.d. 4. Faltex one tablet daily. 5. Atenolol 50 mg q.d. 6. Zocor 20 mg q.h.s. 7. Enteric-coated aspirin 325 mg q.d. 8. Vitamin C 500 mg q.d.  LABORATORY DATA:  Total cholesterol 232, triglycerides 299, HDL 40, LDL 132, total cholesterol HDL ratio 5.8.  Adjustment of lipid therapy was deferred to the patients primary cardiologist.  White count 8.0, hemoglobin 15.3, hematocrit 44.2, platelets 196.  Sodium 140, potassium 3.9, chloride 107, CO2 25, BUN 25, creatinine 1.3, glucose 108.  AST 63, ALT 62, alkaline phosphatase 80, total bilirubin 0.7.  Serial cardiac enzymes were negative for MI.  Chest x-ray on admission showed status post bypass surgery with stents in the right coronary artery.  There was also mild pulmonary vascular prominence without frank edema.  Electrocardiogram shows sinus bradycardia at 54.  PR interval 168, QRS 100, QTC 409, axis 13.  DISCHARGE INSTRUCTIONS:  The patient is to avoid driving, heavy lifting, or tub baths for two days.  Follow a low fat, low cholesterol diet.  He is to watch the catheterization site for any pain, bleeding, or swelling and to call the [facility] for any of these problems.  He is to contact Dr. Shelva Majestic for a follow-up appointment within one to two weeks. Dictated by:   Brita Romp, P.A.C. Attending Physician:  Ronaldo Miyamoto DD:  01/12/02 TD:  01/14/02 Job: [id number]

## 2010-12-10 NOTE — Consult Note (Signed)
NAME:  Bryan [name], KATHAN NO.:  [id number]   MEDICAL RECORD NO.:  [id number]                   PATIENT TYPE:  OBV   LOCATION:  0160                                 FACILITY:  [facility]   PHYSICIAN:  Abigail Miyamoto, MD                DATE OF BIRTH:  08-01-1940   DATE OF CONSULTATION:  05/06/2002  DATE OF DISCHARGE:                                   CONSULTATION   REASON FOR CONSULTATION:  Diverticulitis.   HISTORY OF PRESENT ILLNESS:  The patient is a 70 year old gentleman known to  me, status post laparoscopic cholecystectomy in 1/01, who presents with a  two week history of worsening abdominal pain and pain into the back with  diarrhea.  He apparently had been treated at [facility] for vertigo just prior to that, and has seen Dr. Alwyn Ren in followup.  He reports he has had a history of diverticulitis in the past with two other  previous episodes treated conservatively.  He reports diarrhea, but no blood  per stool.  He has had no dysuria.  He has had no chest pain, fever,  shortness of breath, and no chills.   REVIEW OF SYMPTOMS:  Otherwise only remarkable for the dizziness.   PAST MEDICAL HISTORY:  1. Coronary artery disease.  Status post coronary artery bypass grafting and     multiple stent placements and catheterizations, etc.  2. History of depression.  3. History of hyperlipidemia.  4. History of hypertension.  5. History of diverticulitis.  6. History of colon polyps on colonoscopy.   PAST SURGICAL HISTORY:  1. Coronary artery bypass grafting.  2. Laparoscopic cholecystectomy.  3. Appendectomy.  4. Cataract surgery with implants.   MEDICATIONS:  1. Paxil.  2. Folic acid.  3. Zocor.  4. Imdur.  5. Atenolol.  6. Vitamin C.  7. Aspirin.   SOCIAL HISTORY:  He is married.  He is currently retired.  He does not  smoke, does not drink alcohol.   PHYSICAL EXAMINATION:  GENERAL:  A mildly obese gentleman in no  acute  distress.  VITAL SIGNS:  He is afebrile, vital signs are stable.  Blood pressure is  122/82, heart rate is 82.  HEENT:  Anicteric.  His oropharynx is clear.  NECK:  Supple.  LUNGS:  Clear to auscultation bilaterally.  CARDIOVASCULAR:  Regular rate and rhythm.  ABDOMEN:  Soft, nondistended.  There is moderate tenderness in the left  lower quadrant suprapubic area with guarding.  There are no hernias.  All  incisions are well-healed.  EXTREMITIES:  Warm and well perfused.  RECTAL:  Deferred.   LABORATORY DATA:  The patient has a white blood cell count of 16.0,  hemoglobin of 16.4, hematocrit of 48.1, platelets of 265.  Creatinine is  1.2, potassium is 3.6.  Liver function tests, amylase, lipase, and  urinalysis are normal.  CAT scan of the  abdomen and pelvis shows stranding  in the fat around the sigmoid colon with thickening of the wall of the  colon, and some extra luminal gas suggestive of microperforation without  evidence of abscess.   IMPRESSION:  The patient is with sigmoid diverticulitis.   PLAN:  Continue conservative management with IV antibiotics and bowel rest.  Again, this is his third attack.  I have discussed elective resection with  him and avoidance of a colostomy.  If he does not resolve this episode in  the hospital he will no doubt need surgery this admission.  If we could not  clean him out with a bowel prep he would need a colostomy.  It he does  improve, we would plan elective resection in four to six weeks pending  cardiac clearance.  I will follow him closely while he is in the hospital.                                               Abigail Miyamoto, MD    DB/MEDQ  D:  05/06/2002  T:  05/06/2002  Job:  [id number]

## 2010-12-10 NOTE — Cardiovascular Report (Signed)
[place]  Patient:    Bryan [name], Bryan [name]                     MRN: [id number] Proc. Date: 09/19/00 Adm. Date:  [id number] Attending:  Ronaldo Miyamoto CC:         Macarthur Critchley. Shelva Majestic, M.D.  Titus Dubin. Alwyn Ren, M.D. [facility]  Cath Lab   Cardiac Catheterization  INDICATIONS:  Bryan [name] is a 70 year old white male extremely well known to me. He had prior revascularization surgery in 1991.  He had a percutaneous intervention done of the diagonal branch through the previously placed diagonal graft.  He has been followed and most recently one year ago had evidence of progressive disease of the distal right graft.  He was treated medically and ultimately with EECP but has developed markedly progressive angina.  He was therefore brought to the catheterization laboratory for further evaluation.  PROCEDURE: 1. Left heart catheterization. 2. Selective coronary angiography. 3. Selective left ventriculography. 4. Saphenuos vein graft angiography x 2. 5. Selective left internal mammary angiography. 6. PTCA and stenting of the saphenuos vein graft to the distal right coronary    artery and distal circumflex vessel.  DESCRIPTION OF PROCEDURE:  The patient was brought to the catheterization lab and prepped and draped in the usual fashion.  1% Xylocaine was used for local anesthesia.  Through an anterior puncture, the right femoral artery was easily entered.  Views of the left coronary artery were obtained in multiple angiographic projections.  The right was known to be totally occluded and therefore was not injected to save contrast.  The saphenuos vein grafts were then injected as was the internal mammary.  The internal mammary was accessed using an internal mammary catheter as well as a glide wire.  We then turned out attention to possible options.  We elected to enroll him in the extract trial as there did not appear to be a good landing zone for a filter wire  or percusurge balloon.  He was randomized to control therapy.  Subsequently, he was treated with Integrilin and heparin.  The 6 French sheath was exchanged for a 7 [place] sheath.  I explained the details to his wife and then subsequently the patient in detail.  The lesion was crossed with 0.014 Hi-Torque floppy wire, 2.5 mm balloon was used to open up these lesions.  We then used a 4.0 x 13 mm Ultrastent distally in the graft.  In the proximal graft we used a 38 x 4.0 mm Ultrastent.  There was marked improvement in the appearance of all of these vessels.  He had very mild chest pain which had been ongoing since early in the morning.  He was taken to the holding area in satisfactory clinical condition.  HEMODYNAMIC DATA:  Central aortic pressure was 141/81, left ventricular pressure 141/12, no gradient on pullback across the aortic valve.  ANGIOGRAPHIC DATA:  Ventriculography revealed minimal inferobasal hypokinesis. Ejection fraction was 58%.  There was no significant mitral regurgitation.  Left main coronary artery was free of critical disease.  The LAD demonstrated an 80% stenosis and then was totally occluded.  The circumflex had a 20% stenosis and then was totally occluded beyond the marginal.  The marginal itself had tandem 70% stenoses.  The internal mammary to the distal LAD was widely patent.  The saphenuos vein graft to the diagonal and distal marginal were also patent. There was a 70% stenosis beyond the insertion of the graft  into the diagonal.  The right coronary artery was not injected as it was previously known to be occluded.  The saphenuos vein graft to the distal right demonstrated progression of disease.  There was a 30% area of irregularity proximally which was thought to be a valve.  This was followed by a 60% area of narrowing and then a subtotal occlusion which had progressed from the previous study.  There was a distal 90% stenosis.  Following stenting, the  three distal areas were reduced to 0%. The top area of 30% which looked like an area of a valve was not stented.  CONCLUSION: 1. Normal left ventricular function. 2. Continued patency of the internal mammary to the LAD. 3. Continued patency of the saphenuos vein graft to the diagonal and obtuse    marginal with distal insertion disease. 4. Progression of disease into the right coronary graft with successful    percutaneous stenting on an extract randomized protocol.  DISPOSITION:  The patient will be treated with Plavix.  Follow-up will be with Dr. Riley Kill and Dr. Shelva Majestic. DD:  09/19/00 TD:  09/20/00 Job: [id number]

## 2010-12-10 NOTE — Discharge Summary (Signed)
NAMEAUGUST, LONGEST NO.:  [id number]   MEDICAL RECORD NO.:  [id number]           PATIENT TYPE:   LOCATION:                                 FACILITY:   PHYSICIAN:  Cherylynn Ridges, M.D.         DATE OF BIRTH:   DATE OF ADMISSION:  01/26/2005  DATE OF DISCHARGE:  02/01/2005                                 DISCHARGE SUMMARY   DISCHARGE DIAGNOSES:  Ventral hernia.   PRINCIPAL PROCEDURE:  Ventral hernia repair.   ADDITIONAL DIAGNOSES:  Coronary artery disease.  Patient usually is on  Plavix.   L-arginine, Plavix, Zocor, Prevacid for reflux.   Previous surgery has been by Dr. Carman Ching for colon resection.   DIET:  Regular.   CONDITION ON DISCHARGE:  Stable.   BRIEF SUMMARY OF HOSPITAL COURSE:  The patient was admitted in July for an  elective ventral hernia repair.  This procedure was done on the day of  admission which was July 5.  He did well clinically in the hospital.  Was  discharged to home on postoperative day six tolerating a diet well.  Maximum  temperature within the last 24 hours was 100.4 but he was doing well and had  good urine output, no evidence of wound infection.  He is to follow up to  see me in two weeks.      Cherylynn Ridges, M.D.  Electronically Signed     JOW/MEDQ  D:  11/09/2005  T:  11/10/2005  Job:  [id number]

## 2010-12-10 NOTE — Op Note (Signed)
[place]. [facility]  Patient:    Bryan [name]                      MRN: [id number] Proc. Date: 08/10/99 Adm. Date:  [id number] Attending:  Abigail Miyamoto A                           Operative Report  PREOPERATIVE DIAGNOSIS:  Symptomatic cholelithiasis.  POSTOPERATIVE DIAGNOSIS:  Symptomatic cholelithiasis.  OPERATION PERFORMED:  Laparoscopic cholecystectomy.  SURGEON:  Abigail Miyamoto, M.D.  ANESTHESIA:  General endotracheal and 0.25% Marcaine plain.  ESTIMATED BLOOD LOSS:  Minimal.  DESCRIPTION OF PROCEDURE:  The patient was brought to the operating room and identified as [name].  He was placed on the operating table and general  anesthesia was induced.  His abdomen was then prepped and draped in the usual sterile fashion.  Using a #15 blade, a small transverse incision was made just below the umbilicus.  The incision was carried down through the abdominal fascia ____________ hemostat.  The fascia was then entered with a scalpel after the hemostat was used to puncture into the peritoneal cavity.  Next, a 0 Vicryl pursestring suture was placed around the fascial opening.  The Hasson port was hen placed through the umbilicus and insufflation of the abdomen was begun.  Next, n 11 mm port was placed in the patients epigastrium and two 5 mm ports were placed in the patients right flank under direct vision.  The gallbladder was then grasped and retracted above the liver bed.  Several adhesions to the gallbladder were taken down with blunt dissection.  Next, the cystic duct was easily dissected out and  clipped three times proximally and once distally and cut with scissors.  The cystic artery was also dissected out ____________ twice proximally and once distally and cut as well.  The gallbladder was then slowly dissected free from the liver bed  with the electrocautery.  The gallbladder was then placed in an endo sac and removed  through the umbilicus.  Hemostasis in the liver bed appeared to be achieved with the cautery.  The abdomen was then thoroughly irrigated with copious amounts of normal saline.  Next, all ports were removed under direct vision.  The abdomen was deflated.  All wounds were then anesthetized with 0.25% Marcaine and closed  with 4-0 Vicryl subcuticular sutures.  Steri-Strips, gauze and Tegaderm were then applied.  The patient tolerated the procedure well.  All sponge, needle and instrument counts were correct at the end of the procedure.  The patient was then extubated in the operating room and taken in stable condition to the recovery room. DD:  08/10/99 TD:  08/10/99 Job: [id number]

## 2010-12-10 NOTE — Discharge Summary (Signed)
. [facility]  Patient:    Bryan [name], Bryan [name] Visit Number: [id number] MRN: [id number]          Service Type: MED Location: [id number] Attending Physician:  Ronaldo Miyamoto Dictated by:   Annett Fabian, P.A. Adm. Date:  02/21/2001 Disc. Date: 02/23/2001   CC:         Titus Dubin. Alwyn Ren, M.D. [facility]  Maisie Fus D. Riley Kill, M.D. [facility]   Discharge Summary  DISCHARGE DIAGNOSES: 1. Coronary artery disease status post bypass surgery 1991, status post    cardiac catheterization this admission. 2. Hyperlipidemia. 3. Hypertension. 4. Depression. 5. Gout.  HOSPITAL COURSE:  Bryan [name] is a 70 year old male who underwent bypass surgery in 1991 by Dr. Tyrone Sage.  In February 2002 he had stents placed in two of his grafts.  He presented to the emergency room complaining of a total of about three weeks of chest pain which had increased in the past few days.  He was seen and admitted by Dr. Juanito Doom.  Dr. Daleen Squibb felt that given the remoteness of the patients bypass surgery and the recentness of his interventions that it would be prudent to do a catheterization.  The next day the patient was taken to the catheterization laboratory by Dr. Bonnee Quin.  CATHETERIZATION RESULTS: 1. Continued patency of the graft from the internal mammary to moderately    diseased left anterior descending. 2. Continued patency of the saphenous vein graft to the diagonal and obtuse    marginal. 3. Continued patency of previously stented graft to the posterior descending    and distal circumflex. 4. There is some small progression and a small septal perforator approximately    90%.  Dr. Riley Kill felt that continued medical therapy would be the best option for the patient along with possibility of a repeated course of EECP.  The following day the patient reported some mild chest pain, however, is not short of breath.  Fasting lipid panel revealed total cholesterol  240, triglycerides 320, HDL 37, ratio 6.5, LDL 139.  In light of this Dr. Riley Kill elected to start the patient on Zocor 20 mg.  Otherwise, he felt the patient was stable for discharge.  DISCHARGE MEDICATIONS: 1. Allopurinol 300 mg q.d. 2. Paxil 20 mg q.p.m. 3. Folic acid 1 mg q.d. 4. Vitamin C 500 mg q.d. 5. Enteric coated aspirin 325 mg q.d. 6. Imdur 60 mg q.d. 7. Zocor 20 mg q.p.m. 8. Nitroglycerin sublingually p.r.n.  DISCHARGE INSTRUCTIONS:  Patient was advised to avoid driving, heavy lifting, or tub baths for two days.  He is advised to follow a low fat, low cholesterol diet.  He is instructed to watch the catheterization site for any pain, bleeding, or swelling and to call the [place] office for any of these problems.  He is to get followup liver function tests and fasting lipid panel around the middle of September.  He is to follow-up with Dr. Alwyn Ren as needed or scheduled.  He is to follow-up with Dr. Riley Kill in the office at 5 p.m. on Wednesday, August 7.  LABORATORIES:  White count 11.1, hemoglobin 16.3, hematocrit 46.6, platelets 237,000.  Sodium 141, potassium 4.0, chloride 107, CO2 27, BUN 21, creatinine 1.3, glucose 129.  AST 47, ALT 75, ALP 87.  Total bilirubin 0.9.  Serial cardiac enzymes were negative for MI.  Chest x-ray showed evidence of prior bypass surgery.  There was no active disease.  Electrocardiogram revealed sinus bradycardia at approximately 51, PR interval  of 0.158, QRS 0.096, QTC 0.411, axis 8. Dictated by:   Annett Fabian, P.A. Attending Physician:  Ronaldo Miyamoto DD:  03/15/01 TD:  03/16/01 Job: [id number]

## 2010-12-10 NOTE — Discharge Summary (Signed)
NAME:  RACE, LATOUR NO.:  [id number]   MEDICAL RECORD NO.:  [id number]                   PATIENT TYPE:  INP   LOCATION:  5743                                 FACILITY:  [facility]   PHYSICIAN:  Abigail Miyamoto, M.D.              DATE OF BIRTH:  01-May-1941   DATE OF ADMISSION:  06/17/2002  DATE OF DISCHARGE:  06/23/2002                                 DISCHARGE SUMMARY   DISCHARGE DIAGNOSES:  1. Diverticulitis.  2. Status post left partial colectomy.   HISTORY OF PRESENT ILLNESS:  The patient is a 70 year old gentleman who has  had multiple bouts of sigmoid diverticulitis.  He was, therefore, admitted  for elective sigmoid   HOSPITAL COURSE:  The patient was admitted 06/17/2002 and taken to the  operating room where he underwent left partial colectomy.  He tolerated the  procedure well and was taken in stable condition to the regular surgical  floor.   On postoperative day #1, he was doing well except for some nausea.  At this  time, his laboratory data was normal.  His abdomen was soft, incision  healing well.  His pain was controlled by anesthesia.   By postoperative day #2, he was still having significant pain.  His  creatinine was mildly elevated at this time, so his Toradol was held.  Secondary to continued pain.  The epidural was stopped, and he was started  on PCA.   By postoperative day #3, he was continuing to slowly improve and began to  ambulate.  He did have some confusion overnight, and this was thought to be  secondary to Paxil withdrawal, so his Paxil was resumed.  Because of his  increasing agitation, his primary care physician, Dr. Alwyn Ren, came and saw  the patient as well.  He continued to have confusion during the night over  the next several postoperative days but was tolerating liquids at this time.  Psychiatry came to evaluate the patient as well as prescribed medicines to  help him get over the worsening withdrawal from  the Paxil.  This was done  with Haldol as well as other medication prescribed by Dr. Jeanie Sewer.   He slowly began improving over the next several days, and by postoperative  day #6, his confusion had resolved, and he was awake and alert.  He was  having bowel movements.  His abdomen was soft.  Incision was healing well.  At this point in view of his improvement, decision was made to discharge the  patient to home.   DISCHARGE DIAGNOSES:  1. History of sigmoid diverticulitis status post left partial colectomy.  2. Acute Paxil withdrawal.   DISCHARGE DIET:  Regular   DISCHARGE ACTIVITIES:  He is to do heavy lifting over the next five weeks of  anything greater than 20 pounds.   DISCHARGE MEDICATIONS:  He will resume his home medications.  He will take  Percocet for  pain.    WOUND CARE:  He may shower.   FOLLOW UP:  He will follow up in my office in one week after discharge.                                                 Abigail Miyamoto, M.D.    DB/MEDQ  D:  07/15/2002  T:  07/16/2002  Job:  [id number]

## 2010-12-10 NOTE — Letter (Signed)
May 04, 2006    Venita Lick. Russella Dar, MD, FACG  [address]  [place]  [place] [address]   RE:  Bryan [name], Bryan [name]  MRN:  [id number]  /  DOB:  May 30, 1941   Dear Judie Petit,   Thank you for seeing Bryan [name] in consultation; you are scheduled to see him  October 11 at 11:15.   I have seen him on two occasions, 09/10 and 09/20 with symptoms suggestive  of diverticulitis.  When seen on 09/10 he was prescribed Flagyl 500 mg 3x a  day as well as Amoxil 500 mg 3x a day.  When seen on 09/20 he was completing  his last day of therapy, but continued to have stomach and abdominal pain.  He had noted improvement of the vomiting and diarrhea which were the  presenting symptoms on 09/10.  He had also had a brief period of dark stool  which resolved.  Again, he remained afebrile and weight was stable on 09/20.   There was a question of a retained suture at the umbilicus.  There was no  erythema or purulence noted in this area.  He does have incisional hernias.  His abdomen was slightly tender inferiorly in the left lower quadrant.   His white count was 8500, hematocrit 44.6, amylase was 105 and lipase 80.  Liver function tests were normal.  Repeat lipase on 10/03 was 33, amylase  46. Hemoccult cards were checked, all of which were positive.  Because of  ongoing abdominal pain, referral was made for GI evaluation.   His past medical history is long and complicated.  He has coronary artery  disease and has had bypass grafting. He has had multiple catheterizations.  He had hepatitis in 1996 and also he has  had an appendectomy.  Colonoscopy in 2000 was negative.  He had a  cholecystectomy in 2004.  He has had extensive bowel surgery and had  approximately 4 feet of bowel removed. Abdominal surgery has been  complicated by residual seroma for which he is being followed by Dr. Lindie Spruce.   I appreciate your evaluations and recommendations and care respectfully.    Sincerely,     Bryan Dubin. Alwyn Ren,  MD,FACP,FCCP    [facility]/MedQ  /  Job #:  [id number]  DD:  05/04/2006 / DT:  05/04/2006

## 2010-12-10 NOTE — Cardiovascular Report (Signed)
. [facility]  Patient:    Bryan [name], Bryan [name] Visit Number: [id number] MRN: [id number]          Service Type: MED Location: [phone] Attending Physician:  Ronaldo Miyamoto Dictated by:   Arturo Morton Riley Kill, M.D. [facility] Proc. Date: 01/11/02 Admit Date:  01/09/2002 Discharge Date: 01/12/2002   CC:         Macarthur Critchley. Shelva Majestic, M.D.  CV Laboratory   Cardiac Catheterization  INDICATIONS: The patient is a 70 year old white male, who presents with recurrent substernal chest pain. The patient had revascularization surgery in the early 1990s. He had dilatation of the diagonal just after the vein graft insertion, and he has also had two stents placed in the saphenous vein graft to the distal right coronary circulation. He now presents with recurrent symptoms and need for repeat catheterization.  PROCEDURES: 1. Left heart catheterization. 2. Selective coronary arteriography. 3. Selective left ventriculography. 4. Saphenous vein graft angiography x2. 5. Selective left internal mammary angiography x1.  DESCRIPTION OF PROCEDURE: The procedure was performed from the right femoral artery using 6 French catheters.  He tolerated the procedure without complication. He was taken to the holding area in satisfactory clinical condition.  HEMODYNAMICS: 1. Central aortic pressure 128/75, mean 97. 2. Left ventricular 129/11/21. 3. No gradient on pullback across the aortic valve.  ANGIOGRAPHIC DATA: 1. Left ventriculography was performed in the RAO projection. Overall,    systolic function appeared to be well preserved and no definite    segmental abnormalities contraction were identified. Because of    ______ pacification, ejection fraction was not calculated by the    RAO technique. 2. The left main coronary artery was free of critical disease. 3. The left anterior descending artery is totally occluded. 4. The circumflex coronary artery provides a small  intermediate and then    a marginal. The marginal has about 70% narrowing in the proximal vessel    and is a small to moderate sized marginal. The circumflex proper is totally    occluded. 5. The right coronary artery was totally occluded and there is faint    visualization of bridging collaterals. 6. The internal mammary to the distal LAD is intact. There is a kink noted    in the left internal mammary which has about 40% eccentric narrowing but    there appears to be brisk flow throughout the mammary into the distal    LAD. The distal LAD itself has 30% narrowing in the multiple locations.    The vessel also fills the LAD retrograde and into the diagonal system with    80% proximal narrowing of the diagonal. 7. The saphenous vein graft sequentially to the diagonal and distal    circumflex is widely patent. The diagonal itself is relatively small and    there is about 30-50% narrowing at the previous site of angioplasty. This    does not appear to be significant. The distal vessel inserts into a    circumflex marginal which then fills another marginal through a retrograde    filling. 8. The saphenous vein graft to the PDA and distal circumflex is patent.    There is about 30% narrowing proximally. This is followed by a long area    of stenting which is widely patent. Distally, there is a stent which    overlaps the insertion into the PDA system. The PDA system is small and    diffusely diseased and it fills into a diffusely diseased  posterolateral    system. There is about 40 to perhaps even 50% eccentric narrowing but    there is flow across this area which does not appear to be a highly    compromised lumen. The distal insertion inserts into a distal circumflex    branch, which also is somewhat diffusely diseased and fills retrograde.  CONCLUSIONS: 1. Preserved overall left ventricular function. 2. Continued patency of the internal mammary to the distal left anterior    descending  circulation. 3. Continued patency of the saphenous vein graft to the diagonal and    obtuse marginal. 4. Continued patency of the saphenous vein graft to the posterior descending    artery and distal circumflex with prior stenting of these vessels.  DISPOSITION: The patient is stable. Importantly, the grafts do not appear to be critically closed. There is a fair amount of distal vessel disease. Specifically, the PDA to which the RCA graft inserts is diffusely diseased. The vein graft in the distal circulation also is not perfect in that there is about 40% eccentric narrowing. Runoff into the distal right circulation appears to be a little bit slow, but I think this is most likely related to the native vasculature. In addition, the vein graft to the diagonal and circumflex system appears unchanged and likewise the internal mammary to the LAD appears to be relatively intact. There is also a circumflex marginal branch which could cause some problems, although after intracoronary nitroglycerin, the worse stenosis appears to be 70, then perhaps 75% and should not be causing significant amounts of rest angina. Based on the above findings, it would be my leaning in the direction of continued medical therapy. He has potentially multiple sources of chest pain and ischemia, but at the present time, I see nothing that would benefit directly from percutaneous intervention. A functional study to assess focal ischemia would be perhaps helpful if we eventually decide to do anything other than medication. I have reviewed the films in detail with the patients wife. Dictated by:   Arturo Morton Riley Kill, M.D. [facility] Attending Physician:  Ronaldo Miyamoto DD:  01/11/02 TD:  01/14/02 Job: [id number]

## 2010-12-10 NOTE — H&P (Signed)
NAME:  Bryan [name], Bryan [name] NO.:  [id number]   MEDICAL RECORD NO.:  [id number]                   PATIENT TYPE:  INP   LOCATION:  0445                                 FACILITY:  [facility]   PHYSICIAN:  Adolph Pollack, M.D.            DATE OF BIRTH:  Jul 19, 1941   DATE OF ADMISSION:  09/09/2003  DATE OF DISCHARGE:                                HISTORY & PHYSICAL   CHIEF COMPLAINT:  Left lower quadrant pain, nausea.   HISTORY OF PRESENT ILLNESS:  Bryan [name] is a 70 year old male who  underwent a sigmoid colectomy for diverticulitis on June 17, 2002.  Approximately 2-3 weeks ago, he was diagnosed with recurrent left-sided  diverticulitis, which was noted on a CT scan.  The white blood cell count  was mildly elevated at 11,000.  He was placed on oral ciprofloxacin, Flagyl,  and Vicodin.  Initially he got better, but now his symptoms have returned  with increasing lower abdominal pain, some nausea, and chills.  He called me  last night, and I asked him to come to the office this morning.  He has had  2 small bowel movements today; the last one was loose.  No blood in his  stool.   PAST MEDICAL HISTORY:  1. Coronary artery disease - does have some active angina at this time     intermittently.  2. Anxiety disorder.  3. Hyperlipidemia.  4. Hypertension.  5. Gastroesophageal reflux disease.  6. Sigmoid diverticulitis.  7. Nephrolithiasis.   PREVIOUS OPERATIONS:  1. Laparoscopic cholecystectomy.  2. Appendectomy.  3. Foot surgery x2.  4. Coronary artery bypass.  5. Partial colectomy.  6. Eye surgery with implants.   ALLERGIES:  LIPITOR.   MEDICATIONS:  1. Paxil 15 mg b.i.d.  2. Zocor 20 mg daily.  3. Atenolol 50 mg daily.  4. Plavix 75 mg daily.  5. Ecotrin one daily.  6. Folic acid daily.  7. Vitamin C 1000 mg daily.  8. Previous 30 mg daily.  9. Benicar 20 mg daily.  10.      Clonazepam 0.5 mg q.8h. p.r.n. panic attack.   SOCIAL  HISTORY:  Denies tobacco or alcohol use.  He is married and here with  his wife.   FAMILY HISTORY:  Strongly positive for heart disease.   REVIEW OF SYSTEMS:  CONSTITUTIONAL:  He has had some chills and sweats at  night.  EYES:  He has bilateral cataracts.  EARS/NOSE/THROAT:  Negative.  CARDIOVASCULAR:  Known coronary artery disease and some intermittent angina.  He is being treated by Dr. Riley Kill.  PULMONARY:  Does have intermittent  shortness of breath.  GI:  History of hepatitis and jaundice.  GU:  Had a  history of urinary tract infection and kidney stone.  NEUROLOGIC:  No stroke  or seizure.  PSYCHIATRIC:  Panic attacks along with his anxiety disorder.  ENDOCRINE:  No diabetes or thyroid problems.  HEMATOLOGIC:  No known  bleeding disorders or deep vein thrombosis.   PHYSICAL EXAMINATION:  GENERAL:  An uncomfortable-appearing male who is  pleasant and cooperative.  VITAL SIGNS:  He is afebrile.  SKIN:  Cool and moist.  HEENT:  Eyes - extraocular motions intact.  No icterus.  NECK:  Supple with a right sternocleidomastoid area fullness.  No thyroid  enlargement.  RESPIRATORY:  Breath sounds are equal and clear.  Respirations are  unlabored.  CHEST:  Well-healed sternal scar present.  CARDIOVASCULAR:  Heart demonstrates a regular rate and rhythm.  No JVD  noted.  No lower extremity edema.  ABDOMEN:  Soft with small upper abdominal scars, and a lower midline scar  present.  He has left lower quadrant tenderness and guarding.  No obvious  masses noted.  Hypoactive bowel sounds noted.  MUSCULOSKELETAL:  He has a scar in the right leg.  He has full range of  motion and normal gait and station.   IMPRESSION:  1. Acute recurrent left-sided diverticulitis - failing outpatient management     with oral antibiotics.  2. Coronary artery disease - has some intermittent angina.  Cardiologist is     Dr. Shawnie Pons.  3. Anxiety disorder with panic attacks, currently on Paxil and  p.r.n.     Klonopin.  4. Hypertension.  5. Gastroesophageal reflux disease.   PLAN:  Admission to the hospital.  IV fluid hydration.  I will start him on  IV Zosyn for broader coverage.  Will obtain CT scan of the abdomen and  pelvis today, and do admission blood work, as well.                                               Adolph Pollack, M.D.    Kari Baars  D:  09/09/2003  T:  09/09/2003  Job:  [id number]

## 2010-12-10 NOTE — Cardiovascular Report (Signed)
NAME:  Bryan [name], Bryan [name] NO.:  [id number]   MEDICAL RECORD NO.:  [id number]                   PATIENT TYPE:  INP   LOCATION:  2905                                 FACILITY:  [facility]   PHYSICIAN:  Veneda Melter, M.D.                   DATE OF BIRTH:  [birth year]   DATE OF PROCEDURE:  03/03/2003  DATE OF DISCHARGE:                              CARDIAC CATHETERIZATION   HISTORY:  Bryan [name] is a 70 year old gentleman who presents with unstable  angina.  The patient has a known history of coronary disease and has  undergone surgical revascularization in 1991.  He has also undergone a  percutaneous intervention in the past.  He presents now with unstable angina  that is reminiscent of his prior pain.  However, this episode was more  severe and persisted, only partially relieved with nitroglycerin at home.  Presented to the hospital, stabilized medically, and ruled out for acute  myocardial infarction.  He presents for further assessment.   TECHNIQUE:  Informed consent was obtained.  The patient was brought to the  catheterization laboratory.  A 6-French sheath placed in the right femoral  artery using modified Seldinger technique.  A 6-French JL4 and JR4 catheter  was then used to engage the left and right coronary arteries.  Selective  angiography performed in various projections using manual injections of  contrast.  A JR4 catheter was then used to engage the two saphenous vein and  one internal mammary bypass grafts.  Again, selective angiography was  performed.  Subsequently, a 6-French pigtail catheter was then advanced left  ventricle and left ventriculogram performed using power injections of  contrast.  At the termination of this case catheters were removed.  The  sheath was secured in position.  The patient transferred to the holding area  for further anticoagulation.   FINDINGS:  1. Left main trunk:  Medium caliber vessel with mild irregularities.  2. LAD:  This is a medium caliber vessel that provides two diagonal     branches.  The LAD is 100% occluded proximally with extent of occlusion     extending up to the bifurcation of the second diagonal branch.  There is     then moderate narrowing of 70-80% involving the bifurcation with this     second diagonal branch.  The distal LAD fills the LIMA graft and has mild     diffuse disease.  The first diagonal branch is a small caliber vessel     that bifurcates.  It fills the saphenous vein graft.  This diagonal     branch has diffuse disease of 50-60%.  3. Left circumflex artery is a medium caliber vessel and provides two     marginal branches.  The left circumflex system has moderate disease of 30-     40% in proximal AV segment.  The mid AV segment is then 100%  occluded     after the first marginal branch.  First marginal branch has moderate     disease with 60% in the proximal mid section.  Second marginal branch     fills via continuation of the saphenous vein graft to the diagonal and     exhibits mild disease of 20%.  4. Right coronary artery is dominant.  This is a medium caliber vessel that     provides a posterior descending artery and a posterior ventricular branch     in the terminal segment.  The right coronary is 100% occluded in the mid     section.  There is then severe disease of 90% distally.  Posterior     descending artery and posterior ventricular branch filled the saphenous     vein graft and had mild diffuse disease of 30-40%.  5. LIMA to the LAD is patent.  6. Saphenous vein graft to first diagonal branch of the LAD with     continuation to the second marginal branch left circumflex artery is     patent, mild disease.  7. Saphenous vein graft to the PDA to the right coronary artery is patent.     There is high grade hazy narrowing of 80% in the proximal segment.  There     is then a long segment of stent in the mid section that is patent with     mild disease of  20-30%.  The distal section saphenous vein graft also has     a previously placed stent that is patent.  8. LV:  Normal end-systolic and end-diastolic dimensions.  Overall left     ventricular function is well preserved.  Ejection fraction greater than     50%.  No mitral regurgitation.  LV pressure is 140/15.  Aortic is 140/80.     LVEDP equals 25.   ASSESSMENT/PLAN:  Bryan [name] is a 70 year old gentleman with native three  vessel coronary artery disease and well preserved left ventricular function.  He has progression of atherosclerosis in a saphenous vein graft to the right  coronary artery that appears unstable.  The patient will receive a Plavix  load and will undergo staged intervention to this vein graft.                                               Veneda Melter, M.D.    Melton Alar  D:  03/03/2003  T:  03/03/2003  Job:  [id number]

## 2010-12-10 NOTE — H&P (Signed)
[place]. [facility]  Patient:    UNDRA, HARRIMAN                       MRN: Adm. Date:  10/04/99 Attending:  Arturo Morton. Riley Kill, M.D. [facility] Dictator:   Abelino Derrick, P.A.C. [facility]                         History and Physical  CHIEF COMPLAINT:  Chest pain.  HISTORY OF PRESENT ILLNESS:  Mr. Nguyen is a 70 year old male with a history f coronary disease.  He says he has had a total of 15 catheterizations in the past. He had bypass surgery in 1991.  His last catheterization was in January 2000. his revealed a patent LIMA to LAD with a 50% distal LAD, patent SVG to the circumflex and OM sequentially, 50% narrowing of the SVG to RCA and a 90% first diagonal narrowing and normal LV function.  He had a negative Cardiolite at that time. e recently saw Dr. Riley Kill in the office, complaining of recurrent chest pain and was to be admitted for same day catheterization.  He had recurrent chest pain today and was seen in the emergency room.  Symptoms are somewhat improved with nitroglycerin times three.  The patient denies any associated nausea.  He does have some shortness of breath and claims he has diaphoresis.  PAST MEDICAL HISTORY:  He has a history of hyperlipidemia.  He has a history of  depression. He had laparoscopic cholecystectomy January of this year.  He has also had bilateral lens implants for cataracts.  CURRENT MEDICATIONS:  Aspirin q.d.  Norvasc 5 mg a day. Paxil 20 mg a day. Lipitor 10 mg a day.  Imdur 30.  Atenolol 15 mg a day.  Folic acid and vitamin C.  ALLERGIES:  He has no known drug allergies.  SOCIAL HISTORY:  He is married.  He is a nonsmoker.  FAMILY HISTORY:  Positive for coronary disease. Father died at age 49 of an MI.  Mother died of a stroke at 34. He has one brother who had a heart attack in his 3s and one brother has bypass.  REVIEW OF SYSTEMS:  Essentially unremarkable except as noted above.  There is no history of  GI bleeding or prostate problems. He has had polyps in the past.  PHYSICAL EXAMINATION:  VITAL SIGNS:  Blood pressure 102/64, pulse 62 and regular. He is afebrile.  His  weight is 230.  GENERAL:  Well-developed, anxious male in no acute distress.  HEENT: Normocephalic.  Extraocular movements are intact.  Sclerae nonicteric. ids and conjunctivae are within normal limits.  NECK: Without bruit and without JVD.  CHEST:  Clear to auscultation and percussion.  CARDIAC:  Reveals a regular rate and rhythm without murmur, rub or gallop. Normal S1 and S2.  ABDOMEN:  Nontender.  There is no hepatosplenomegaly.  Bowel sounds are present.  EXTREMITIES:  Without edema.  There are no bruits.  Distal pulses are 3+/4.  NEUROLOGIC:  Grossly intact.  He is awake, alert, oriented and cooperative.  EKG shows normal sinus rhythm without acute changes.  Chest x-ray and labs are pending.  IMPRESSION: 1. Chest pain rule out progression of coronary disease. 2. Coronary artery bypass grafting in 1991 with a diagonal angioplasty in 1992,    the patient has had subsequent catheterizations in 1993, 1994, 1995, 1997,    1998, and most recently January 2000. 3.  Hyperlipidemia. 4. Depression.  PLAN:   To admit. Rule out MI. We will start Lovenox and IV nitroglycerin. Plan to move up his catheterization until tomorrow. DD:  10/04/99 TD:  10/04/99 Job: [id number]

## 2010-12-10 NOTE — Discharge Summary (Signed)
[place]. [facility]  Patient:    Bryan [name], Bryan [name] Visit Number: [id number] MRN: [id number]          Service Type: NES Location: NESC Attending Physician:  Tania Ade Dictated by:   Lucrezia Starch. Ovidio Hanger, M.D. Admit Date:  08/02/2001 Discharge Date: 08/02/2001                             Discharge Summary  DIAGNOSIS:  Ureteral lithiasis with pain and fever.  PROCEDURE:  Cystourethroscopy, left retrograde ureteral pyelogram, placement of left double J stent, and left renal pelvic culture and sensitivity on July 19, 2001.  HISTORY OF PRESENT ILLNESS:  Mr. Bryan [name] is a very nice 70 year old white male who presented with left renal colic and nausea and vomiting.  He subsequently underwent a CT scan which demonstrated a 4 mm stone in the proximal left ureter with a mild hydronephrosis.  Despite pain medication, he continued to be uncomfortable and also has nausea, but no vomiting, and was admitted for treatment.  PAST MEDICAL HISTORY: SOCIAL HISTORY: FAMILY HISTORY: REVIEW OF SYSTEMS:  Please see history and physical for full detail.  PHYSICAL EXAMINATION:  VITAL SIGNS:  He is afebrile and vital signs are stable. GENERAL: He is well-developed well-nourished, in mild to moderate distress, oriented x 3.  HEENT: Normal.  NECK: Without masses or thyromegaly. CHEST: Clear to auscultation and percussion without rhonchi.  ABDOMEN: Left flank pain, tenderness noted.  Abdomen is slightly distended.  EXTREMITIES: Normal.  NEUROLOGICAL: Intact.  HOSPITAL COURSE:  The patient was admitted and low grade fever only, but the pain continued.  He was subsequently taken to surgery on July 19, 2001, and underwent cystourethroscopy and left retrograde ureteral pyelogram, and placement of left double J stent with left renal pelvic culture.  By July 19, 2001, TMAX was 101.5 degrees and KUB revealed a stone within the left lower pole calix and  was not obstructive.  He had traumatically pulled his stent out, so the remainder was removed.  He was discharged on antibiotics and pain medications, Cipro and Mepergan Forte.  He was scheduled for the following Thursday for ESWL.  CONDITION ON DISCHARGE:  Improved.  DISCHARGE DIAGNOSIS:  Left ureteral lithiasis. Dictated by:   Lucrezia Starch. Ovidio Hanger, M.D. Attending Physician:  Tania Ade DD:  08/29/01 TD:  08/30/01 Job: [id number]

## 2010-12-10 NOTE — Discharge Summary (Signed)
NAME:  WARDEN, BUFFA NO.:  [id number]   MEDICAL RECORD NO.:  [id number]                    PATIENT TYPE:   LOCATION:                                       FACILITY:   PHYSICIAN:  Rene Paci, M.D. [facility]          DATE OF BIRTH:   DATE OF ADMISSION:  DATE OF DISCHARGE:                                 DISCHARGE SUMMARY   DATE OF BIRTH:  March 04, 1941   DISCHARGE DIAGNOSES:  1. Diverticulitis with  perforation, improved.  2. Hypercholesterolemia.  3. History of coronary artery disease.   DISCHARGE MEDICATIONS:  1. Cipro 500 mg by mouth twice a day.  2. Flagyl 500 mg by mouth every four hours.  3. Vicodin 5/500 one to two tabs by mouth every six hours as needed pain.  4. Atenolol 50 mg by mouth each day.  5. Paxil 20 mg by mouth each day.  6. Zocor 40 mg by mouth each day.  7. Ecotrin 325 mg by mouth each day.  8. Folic acid 1 mg by mouth each day.  9. Vitamin C 1,000 mg by mouth each day.   CONSULTATIONS:  Abigail Miyamoto, M.D. of General Surgery.   CONDITION ON DISCHARGE:  Improved.   DISPOSITION:  The patient is discharged to home.   FOLLOW UP:  With the General Surgeon in approximately two weeks to arrange  for elective outpatient colon resection in 4-6 weeks after resolution of  current inflammatory disease.   HISTORY OF PRESENT ILLNESS:  The patient is a 70 year old gentleman who  presented to [facility] emergency room the evening of  admission complaining of lower abdominal pain worse over the last two weeks.  Please see admission dictation for further details.   HOSPITAL COURSE BY PROBLEM:  1. Diverticulitis with micro perforation. General Surgery consult was     obtained on admission. This is the patient's third episode of     diverticulitis in recent months. But still felt that conservative     management with IV antibiotics and later elective resection would be the     most prudent course. The patient  was treated with IV Cipro and Flagyl and     followed for his pain. He tolerated his liquid diet well without     complications and after four days of IV antibiotics, it was felt that the     patient was stable to change to P.O. antibiotics. He will be continued on     a two week course of these medications to ensure complete resolution of     current inflammatory process.  As stated     above, he will contact his general surgery to arrange for elective     resection in upcoming weeks.  2. Other chronic medical issues. The patient was continued on his other home     medications and there was no exacerbation of any of his  other medical     conditions.                                               Rene Paci, M.D. [facility]    VL/MEDQ  D:  05/29/2002  T:  05/29/2002  Job:  [id number]   cc:   Abigail Miyamoto, M.D.  [address]  [place]  [place] [address]  Fax: [phone]

## 2010-12-10 NOTE — Cardiovascular Report (Signed)
[place]. [facility]  Patient:    Bryan [name], Bryan [name]                     MRN: [id number] Proc. Date: 02/22/01 Adm. Date:  [id number] Attending:  Ronaldo Miyamoto CC:         CV Laboratory  Macarthur Critchley. Shelva Majestic, M.D.   Cardiac Catheterization  INDICATIONS:  The patient is well known to [place].  He has had prior bypass surgery.  He is 70 years old.  He has had a course of EECP.  He has been seeing Dr. Shelva Majestic on a regular basis and has had recurrent angina.  A stress echocardiogram revealed no significant ischemia.  However, his chest pain progressed.  In February of this past year he has had stenting of the saphenous vein graft to the PDA and distal circumflex.  As a result he was brought back to the catheterization lab for further evaluation.  PROCEDURES: 1. Left heart catheterization. 2. Proximal root aortography. 3. Selective coronary arteriography. 4. Saphenous vein graft angiography x2. 5. Selective left internal mammary angiography.  DESCRIPTION OF PROCEDURE:  The patient was brought to the catheterization lab and prepped and draped in the usual fashion.  Xylocaine 1% was used f procedure or local anesthesia.  Through an anterior puncture the right femoral artery was entered.  A 6 French sheath was used.  There was slight resistance in the right iliac.  Standard Judkins catheters were utilized without difficulty.  We did use an IMA catheter to engage the internal mammary artery. There were no complications.  I then reviewed films with Dr. Veneda Melter and the patient was taken to the holding area in satisfactory clinical condition.  HEMODYNAMIC DATA: 1. Central aortic pressure 127/80. 2. Central aortic pressure 117.72. 3. LV pressure 197/12. 4. No gradient on pullback across the aortic valve.  ANGIOGRAPHIC DATA: 1. No left ventriculography was performed to conserve contrast. 2. Central root aortography revealed no evidence of regurgitation or  dissection. 3. The left main was free of critical disease. 4. The LAD was totally occluded beyond the septal.  The septal itself had    a 90% hazy stenosis.  The vessel was only about 1.5 mm in diameter.  This    appeared to be somewhat progressed from the previous study but it was a    small caliber vessel. 5. The circumflex was totally occluded beyond the small marginal.  The    marginal itself had two tandem 70% areas of stenosis and was a small    caliber vessel. 6. The saphenous vein graft to the diagonal and OM was widely patent.    There was about 50% narrowing in the diagonal itself at the previous    site of angioplasty.  The OM was widely patent. 7. The internal mammary to the LAD is patent.  The LAD distally is somewhat    diffusely irregular with multiple 40-50% areas of luminal irregularities. 8. The right coronary artery was totally occluded. 9. The saphenous vein graft to the right coronary artery demonstrates about    30% just prior to a long area of stenting.  The stenting area is widely    patent.  The vessel opens up and the distally at the PDA insertion site and    remains widely patent.  The graft to the posterolateral branch also    remains patent.  There is sluggish flow in the graft that may be due to  distal disease.  CONCLUSIONS: 1. Continued patency of the internal mammary to a moderately diseased left    anterior descending. 2. Continued patency of the saphenous vein graft to the diagonal and obtuse    marginal. 3. Continued patency of a previously stented graft to the posterior    descending and distal circumflex. 4. Some progression in a small septal perforator.  DISPOSITION:  The patient has had continued and recurrent angina.  He may be a candidate for repeat attempted EECP.  He was symptomatically improved following this. DD:  02/22/01 TD:  02/22/01 Job: [id number]

## 2010-12-10 NOTE — Discharge Summary (Signed)
NAME:  Bryan [name], Bryan [name] NO.:  [id number]   MEDICAL RECORD NO.:  [id number]                   PATIENT TYPE:  INP   LOCATION:  2905                                 FACILITY:  [facility]   PHYSICIAN:  Arturo Morton. Riley Kill, M.D.             DATE OF BIRTH:  1941/04/22   DATE OF ADMISSION:  03/02/2003  DATE OF DISCHARGE:  03/04/2003                           DISCHARGE SUMMARY - REFERRING   PROCEDURE(S):  1. Cardiac catheterization.  2. Coronary arteriogram.  3. Left ventriculogram.  4. Percutaneous transluminal coronary angioplasty and stent x2 to one     vessel.   HOSPITAL COURSE:  Bryan [name] is a 70 year old male with a history of  coronary artery disease and bypass surgery who came to the hospital on  March 02, 2003, for a two day history of chest pain.  The patient also had  increased shortness of breath, nausea, vomiting and diaphoresis with this.  The chest pain was constant today and not relieved with four sublingual  nitroglycerin.  He came to the emergency room where he was admitted for  unstable anginal pain and further evaluation.   His enzymes were negative for myocardial infarction but he had recurrent  pain, and cardiac catheterization was indicated.  This was performed on  March 03, 2003.   The cardiac catheterization showed severe noted three-vessel disease, patent  LIMA to the LAD and a patent SVG to the diagonal and OM.  The SVG to RCA has  had two previous interventions, but at a new site there was an 80% stenosis  in the proximal area.  The two previous stent sites were patent.  It was  felt that this was a high grade lesion, responsible for symptoms and  percutaneous intervention as indicated.   Bryan [name] was enrolled in __________ trials and given aspirin 325 mg,  Plavix and heparin.  He had Express stents x2 to the SVG to RCA reducing the  stenosis from 80% to 0% with TIMI 3 flow.  His EF was greater than 50% with  no MR.   Mr.  [name] tolerated the procedure well, and the sheath was removed  without difficulty.  The next day his enzymes postprocedure were negative,  and his other laboratory values were within normal limits.  He had a lipid  profile done which showed a total cholesterol of 199, triglycerides 381, HDL  38, LDL 85.  He is already on Zocor 20 mg q.h.s. for hyperlipidemia.  It was  felt that this should be adjusted by Dr. Riley Kill in the office, and he was  considered stable for discharge on March 04, 2003.   LABORATORY VALUES:  Hemoglobin 14.1, hematocrit 39.5, WBC 9.0, platelets  172.  Sodium 138, potassium 4.0, chloride 104, CO2 of 27, BUN 12, creatinine  1.4, glucose 108.  Chest x-ray:  Clear lungs with enlarged heart.  No acute  process.   DISCHARGE CONDITION:  Improved.  DISCHARGE DIAGNOSES:  1. Unstable anginal pain, status post percutaneous transluminal coronary     angioplasty and stent to the saphenous vein graft to the right coronary     artery this admission.  2. History of percutaneous transluminal coronary angioplasty and stent to     the mid saphenous vein graft to posterior descending artery 2002.  3. History of percutaneous transluminal coronary angioplasty and stent to     the distal saphenous vein graft to posterior descending artery.  4. Aortocoronary bypass surgery in 1991, with left internal mammary artery     to the left anterior descending coronary artery, saphenous vein graft to     diagonal and obtuse marginal, and saphenous vein graft to the posterior     descending artery.  5. Preserved left ventricular function with an ejection fraction of 55% by     catheterization this admission.  6. Severe native three-vessel coronary artery disease.  7. Status post colectomy secondary to diverticulitis.  8. Status post appendectomy and cholecystectomy.  9. Hyperlipidemia.  10.      Status post visually-evoked cortical potential.  11.      History of gout.  12.      History  of depression.  13.      Family history of coronary artery disease.   DISCHARGE INSTRUCTIONS:  1. His activity level is to include no driving, sexual or strenuous activity     for two days.  2. He is to call the office for problems with the catheterization site.  3. He is to  restricted to a low-fat diet.  4. He is to follow up with Dr. Riley Kill and he has appointment arranged.  He     is to follow up with Dr. Alwyn Ren p.r.n.   DISCHARGE MEDICATIONS:  1. Aspirin 325 mg q.d.  2. Zocor 20 mg q.h.s.  3. Paxil 30 mg p.o. q.d.  4. Atenolol 50 mg p.o. q.d.  5. Folate q.d.  6. Vitamin C 1000 mg q.d.  7. Plavix 75 mg q.d.  8. Nitroglycerin p.r.n.   The patient states that he preferred followup appointment with Dr. Shelva Majestic,  and he will cancel the appointment with Dr. Riley Kill if he is able to arrange  an appointment with Dr. Shelva Majestic.      Lavella Hammock, P.A. [facility]                  Thomas D. Riley Kill, M.D.    RG/MEDQ  D:  03/04/2003  T:  03/04/2003  Job:  [id number]   cc:   Titus Dubin. Alwyn Ren, M.D. [facility]   Maisie Fus D. Riley Kill, M.D.   Macarthur Critchley Shelva Majestic, M.D.  [address]  [place]  [place] [address]  Fax: [phone]

## 2010-12-10 NOTE — H&P (Signed)
NAME:  Bryan [name], Bryan [name] NO.:  [id number]   MEDICAL RECORD NO.:  [id number]                   PATIENT TYPE:  OBV   LOCATION:  0160                                 FACILITY:  [facility]   PHYSICIAN:  Isla Pence, M.D. [facility]         DATE OF BIRTH:  Oct 04, 1940   DATE OF ADMISSION:  05/05/2002  DATE OF DISCHARGE:                                HISTORY & PHYSICAL   IDENTIFICATION DATA:  This is a 70 year old white male whose primary care  physician is Dr. Alwyn Ren, whose cardiologist is Dr. Riley Kill, whose  gastroenterologist is Dr. Russella Dar.   CHIEF COMPLAINT:  Lower abdominal pain x2 weeks.   HISTORY OF PRESENT ILLNESS:  This patient was in his usual state of health  until about two weeks ago when he started experiencing some back pain and  abdominal pain.  The abdominal pain was in the lower abdomen and he really  points to his suprapubic area.  He apparently around that time had just been  discharged from [facility] where he was taken with vertigo  like symptoms, but because he had some jerky hand movements, the ambulance,  took him to [facility] which was the closest emergency room for them.  The  patient was subsequently seen by Dr. Alwyn Ren for followup of his hospital  admission.  At that time, the patient says that he was given antibiotics for  the infection.  He does have a history of diverticulitis with acute episodes  two other times.  Dr. Russella Dar had taken care of him before.  His last attack  was a while back.  He does not recall any unusual dietary intake, but does  eat periodically __________ .  He denies any fever at home, but apparently  had a low-grade fever here.  He denies any chills.  Has not had any nausea  or vomiting.  Has maintained a good appetite, but he has noticed diarrhea  over the past two weeks, primarily watery two to three times a day, but  denies any melena, hematochezia.  His last antibiotic dose was  approximately  a week ago.  The patient has had a colonoscopy in the past with his history  of colonic polyp, the last one was two years ago.   ALLERGIES:  No known drug allergies.   CURRENT MEDICATIONS:  1. Atenolol, he thinks it is 50 mg p.o. q.d.  2. Paxil, he is unsure of the exact dose.  The wife is not certain of it     either, but he takes it for claustrophobia.  3. Folic acid 1 mg q.d.  4. Ecotrin 325 mg p.o. q.d.  5. Vitamin C 1000 mg p.o. q.d.  6. Zocor, he is not very sure about the dose either, but he takes it once a     day.  7. Imdur was discontinued by Dr. Riley Kill approximately two weeks ago, and  this was tapered off of him when he had shown for his routine followup     with Dr. Riley Kill, and had told Dr. Riley Kill he had abruptly discontinued     the atenolol.  Dr. Riley Kill had recommended that he not do that, and     placed him back on atenolol, but tapered him off of the Imdur in light of     his dizziness.   PAST MEDICAL HISTORY:  1. Coronary artery disease, status post five vessel coronary artery bypass     grafting in 1991.  Since then, he has had numerous cardiac     catheterizations, a total of 19 between 1990 and 6/03.  His most recent     cardiac catheterization on 01/11/2002, when he presented with some chest     pain showed patent grafts, although one of the grafts and the stent     distally showed some disease, but it was felt that it was better managed     medically.  2. History of gout.  3. The patient denies a history of depression, but states that he does have     claustrophobia for which he takes Paxil.  4. History of hyperlipidemia.  His last check was done recently by Dr.     Riley Kill, and apparently that was good.  5. History of colonic polyp as previously mentioned, last one being done two     years ago.   PAST SURGICAL HISTORY:  1. Five vessel coronary artery bypass grafting in 1991.  2. Status post cholecystectomy.  3. Status post  appendectomy.  4. Bilateral cataract surgery with implants.  5. Left ankle surgery x2 done by Dr. Simonne Come.   SOCIAL HISTORY:  He has been married for the past 23 years, this is his  second marriage.  He does not have any kids.  He does have stepchildren.  He  is retired from the [employer] secondary to his heart disease.  He  has not been able to exercise in a while on account of his left ankle.  When  he was able, he would try and walk.  He denies tobacco or alcohol use, and  denies ever using these.   FAMILY HISTORY:  Myocardial infarction in the father at the age of 97.  He  does not know when his first one was.  Brother is still alive at 62, but he  has had a heart attack.  Another brother died at age of 18 from a myocardial  infarction.  Another brother at the age of 42 died.  His sister also died at  the age of 44, status post complications of coronary artery bypass grafting.  Cerebrovascular accident in the mother who died at the age of 47.  Breast  cancer also in the mother.  There is no hypertension.  There is no diabetes  mellitus.  He does not know if there is any hyperlipidemia.   REVIEW OF SYMPTOMS:  He denies any headache.  He has had the dizziness on  and off for the past two weeks which he says is better now.  As mentioned  earlier, he was admitted at [facility].  Dr. Alwyn Ren has  given him a referral to ENT to further work this up.  He, however, denies  any tinnitus or decreased hearing.  Denies chronic sinus problems also.  He  has noticed a dry cough for the past two to three months.  Denies any  sneezing.  Denies a runny nose or post-nasal drip.  He denies heartburn  also.  Denies chest pain, shortness of breath, or lower extremity edema.  He  sleeps on two pillows, which he says he has always done, and is not  secondary to breathing.  Denies any urinary symptoms.  Denies any melena or hematochezia as mentioned in the HPI.   PHYSICAL  EXAMINATION:  VITAL SIGNS:  His initial temperature was 100.5,  blood pressure of 122/82, pulse of 82, respiratory rate 20.  He is  saturating at 96% on room air.  GENERAL:  He really does not appear to be in any distress.  HEENT:  Not examined, since it was not pertinent to his present illness.  LUNGS: Clear to auscultation bilaterally without any crackles or wheezes.  HEART:  Regular rate and rhythm.  I do not appreciate a murmur.  ABDOMEN:  Protuberant, but not distended.  Bowel sounds are present.  Certainly no high pitched sounds.  He is tender in the suprapubic area, and  elsewhere he is not tender.  There is no rebound.  RECTAL:  As per ER Iria Jamerson, was Hemoccult negative.   LABORATORY DATA:  White blood cell count was 16,000, hemoglobin and  hematocrit of 16.4 and 48.1, platelet count of 265,000.  PMN's of 68,  lymphocyte of 21.  Urinalysis was negative.  His CT scan of the abdomen and  pelvis showed sigmoid diverticulitis with wall thickening and stranding and  a small amount of extra luminal gas, probably early phlegmon, no abscess at  this time.  Amylase and lipase were 43 and 24, which are normal.  His CMP  was done which was essentially normal.  His sodium was 136, potassium 2.6,  chloride and CO2 were 107 and 23, BUN and creatinine were 14 and 1.2,  glucose of 123.  Liver function tests were normal.   ASSESSMENT AND PLAN:  1. Sigmoid diverticulitis with perforation with probable early phlegmon.     There is no rebound at this point in time.  We will go ahead and admit     him to step-down with IV antibiotics.  We will keep him n.p.o. for the     time being in case he needs to go to the operating room.  He does have     stable blood pressure.  There is no suggestion of left shift on his white     count, but certainly this is slightly elevated.  I have called and spoken     to Dr. Magnus Ivan who is on call for general surgery for general surgery to     follow along.  We will  obtain a repeat CBC with diff on 05/07/2002,     unless it is warranted sooner if his clinical examination changes.  If he     does not go to the operating room anytime soon, then he may need a repeat     CT scan of the abdomen and pelvis to see if there is any progression.  2. History of coronary artery disease.  This appears stable.  We will     continue him on his beta blocker which will act as perioperatively for     reducing cardiovascular risk.  3. History of hyperlipidemia.  I am going to hold the Zocor since he is     going to be n.p.o., and it is not a true emergent essential pill at this     time.  4. Dizziness.  He  needs to have that evaluated by ENT as an outpatient.  5. Dry chronic cough in this non-smoker.  The question of whether he might     have laryngeal or pharyngeal reflux secondary to silent gastroesophageal     reflux disease.  Therefore, we will give Protonix IV since he is going to    be n.p.o.                                               Isla Pence, M.D. [facility]    RRV/MEDQ  D:  05/06/2002  T:  05/06/2002  Job:  [id number]   cc:   Titus Dubin. Alwyn Ren, M.D. [facility]   Maisie Fus D. Riley Kill, M.D. [facility]

## 2010-12-10 NOTE — Op Note (Signed)
NAME:  [name], BROUILLET NO.:  [id number]   MEDICAL RECORD NO.:  [id number]                   PATIENT TYPE:  INP   LOCATION:  5743                                 FACILITY:  [facility]   PHYSICIAN:  Abigail Miyamoto, M.D.              DATE OF BIRTH:  02/09/1941   DATE OF PROCEDURE:  06/17/2002  DATE OF DISCHARGE:                                 OPERATIVE REPORT   PREOPERATIVE DIAGNOSES:  Sigmoid diverticulitis.   POSTOPERATIVE DIAGNOSES:  Sigmoid diverticulitis.   PROCEDURE:  Left partial colectomy.   SURGEON:  Abigail Miyamoto, M.D.   ASSISTANT:  Rose Phi. Maple Hudson, M.D.   ANESTHESIA:  General endotracheal anesthesia.   ESTIMATED BLOOD LOSS:  Minimal.   INDICATIONS FOR PROCEDURE:  This patient is a 70 year old gentleman who has  had 3-4 attacks of sigmoid diverticulitis.  He presents now for elective  sigmoid colectomy.   PROCEDURE IN DETAIL:  The patient was brought to the operating room and  identified as Bryan [name].  He was placed supine on the operating table,  and general anesthesia was induced.  His abdomen was then prepped and draped  in the usual fashion.  Next, a midline incision was created with a #10  blade.  The incision was carried down through the fascia with  electrocautery.  The peritoneum was then opened the entire length of the  incision.  Upon entering the abdomen, the patient was found to have  adhesions in the left lower quadrant, consistent with the multiple attacks  of diverticulitis.  At this point, the adhesions were taken down with  electrocautery, and the left colon and sigmoid colon were mobilized along  the white line of Toldt.  No pelvic abscesses were identified. At this  point, an area of descending colon that appeared to be free of diverticular  disease was identified.  It was then clamped off proximally with a towel  clamp and then distally with a Kocher and then transected with a scalpel.  The mesentery was then  taken down, working toward the distal colon with  Kelly clamps and 2-0 silk ties.  An area of rectum was then identified and  likewise clamped with a towel clamp and then a Kocher clamp and transected  with the scalpel.  The rest of the mesentery was taken down with clamps and  silk ties.  The sigmoid colon specimen was then sent to pathology for  identification. Next, silk sutures were placed as stay sutures in both  sections of the colon.  The clamps were then removed.  Next, a single-  layered interrupted 3-0 silk sutured anastomosis was performed  circumferentially, connecting the descending colon to the rectum in an end-  to-end fashion.  A large anastomosis appeared to be created.  The mesentery  defect was then closed with single 3-0 silk suture as well.  The abdomen was  then copiously irrigated with normal saline.  Hemostasis appeared to be  achieved.  At this point, the midline fascia was then closed with a running  #1 Prolene suture.  The skin was then irrigated and closed with skin  staples.  The  patient tolerated the procedure well.  All sponge, needle, and instrument  counts were correct at the end of the procedure.  The patient was then  extubated in the operating room and taken in a stable condition to the  recovery room.                                               Abigail Miyamoto, M.D.    DB/MEDQ  D:  06/17/2002  T:  06/17/2002  Job:  [id number]

## 2010-12-10 NOTE — H&P (Signed)
[facility]  Patient:    DESMAN, POLAK Visit Number: [id number] MRN: [id number]          Service Type: MED Location: [phone] Attending Physician:  Thermon Leyland Dictated by:   Barron Alvine, M.D. Admit Date:  07/18/2001   CC:         Windy Fast L. Ovidio Hanger, M.D.   History and Physical  CHIEF COMPLAINT:  Left flank pain.  HISTORY OF PRESENT ILLNESS:  Mr. Vanderzee is a 70 year old male.  He is a patient of Dr. Earlene Plater and has had several episodes of nephrolithiasis in the past.  Some of these stones have passed spontaneously, and he has required intervention on at least one occasion.  He awoke this morning with typical left-sided renal colic.  Of note, the patient has been having some right-sided back pain and, apparently, had recent imaging through Dr. Earlene Plater office.  He, apparently, was noted to have several stones within his kidney but nothing causing obstruction, and they felt that the right-sided back and abdominal pain he was having was probably nonurologic.  He presented today with fairly severe left-sided flank pain.  A CT scan did demonstrate a 4 mm stone in the proximal left ureter with mild hydronephrosis.  Despite pain medication, he remains uncomfortable, although his pain is better.  He has also had nausea but no emesis.  He has had no fever or chills or difficulty voiding.  PAST MEDICAL HISTORY: 1. Coronary artery disease. 2. Status post bypass surgery over 10 years ago. 3. He is a patient of Dr. Riley Kill and is undergoing additional evaluation for    some ongoing cardiac issues.  CURRENT MEDICATIONS:  Isosorbide, atenolol, Paxil, and Nexium, along with a daily aspirin.  ALLERGIES:  None.  SOCIAL HISTORY:  He is a nonsmoker, nondrinker.  REVIEW OF SYSTEMS:  Otherwise noncontributory.  PHYSICAL EXAMINATION:  GENERAL:  Well-developed, well-nourished male.  VITAL SIGNS:  Blood pressure was 148/74, pulse 54,  respiratory rate 20, afebrile.  ABDOMEN:  Benign.  No CVA tenderness.  LABORATORY DATA:  Creatinine is slightly elevated at 1.7.  Urinalysis does show microscopic hematuria.  ASSESSMENT:  Left proximal ureteral stone.  PLAN:  The patient lives in [place].  He is still having discomfort.  He would prefer admission for pain control and observation.  We will start him on a morphine PCA along with Toradol and IV fluids with urinary straining.  We will make him n.p.o. after midnight in case the patient is having ongoing discomfort for possible stent and/or ureteroscopy in the a.m. by Dr. Earlene Plater. If the stone is passed or if he feels much better in the morning, he may be able to be managed as an outpatient with attempts at spontaneous passage. Dictated by:   Barron Alvine, M.D. Attending Physician:  Thermon Leyland DD:  07/18/01 TD:  07/19/01 Job: [id number]

## 2010-12-10 NOTE — Op Note (Signed)
NAMEJOZEPH, PERSING NO.:  [id number]   MEDICAL RECORD NO.:  [id number]          PATIENT TYPE:  INP   LOCATION:  5736                         FACILITY:  [facility]   PHYSICIAN:  Cherylynn Ridges, M.D.    DATE OF BIRTH:  June 14, 1941   DATE OF PROCEDURE:  01/26/2005  DATE OF DISCHARGE:                                 OPERATIVE REPORT   PREOPERATIVE DIAGNOSIS:  Ventral hernia, postoperative ventral hernia.   POSTOPERATIVE DIAGNOSIS:  Ventral hernia, postoperative ventral hernia with  incarcerated small bowel.   PROCEDURE:  Attempted laparoscopic ventral hernia repair with open ventral  hernia repair with mesh. small bowel resection with primary anastomosis.   SURGEON:  Cherylynn Ridges, M.D.   ASSISTANT:  Ollen Gross. Carolynne Edouard, M.D.   ANESTHESIA:  General endotracheal.   ESTIMATED BLOOD LOSS:  250 mL.   COMPLICATIONS:  Deserosalization and devascularization of a loop small bowel  incarcerated hernia.   CONDITION:  Stable condition. The patient is a stable.   INDICATIONS FOR OPERATION:  The patient is a 70 year old who had a previous  colectomy, colostomy, and takedown colostomy for diverticular disease. He  developed ventral hernias which were becoming increasingly more symptomatic  and comes in now for attempted laparoscopic, possible open repair.   FINDINGS:  The patient had a Swiss cheese pattern of ventral hernia defects  throughout the midline fascia and more extensive superiorly and caudally  with incarcerated loops of small bowel in the inferior defect. This area was  where the bowel was devascularized during the attempts to remove it from the  hernia sac and then we converted to an open procedure.   OPERATION:  The patient was taken to the operating room, placed on table in  supine position. After an adequate endotracheal anesthetic was administered  he was prepped and draped in usual sterile manner exposing the midline of  the abdomen and all the way down to the  flanks bilaterally.   The initial cannula incision was in the left lower quadrant. Made a  transverse incision was 3 cm long using #15 blade and dissected down to the  fascia. It was at the level of fascia that pursestring suture of 0 Vicryl  was passed after opening and then bluntly dissected through into the  peritoneum.   With the placement of this Hasson cannula and securing it in place with a  pursestring suture we initially could not get any visualization but were  able to bluntly get into the peritoneal space. We subsequently passed and  left upper quadrant 10/11 mm cannula, right lower quadrant 5 mm cannula, and  subsequently left upper quadrant 5-mm cannula after we had taken down  multiple adhesions.   Upon visualizing the abdomen from the right upper quadrant you could see the  cannula that was initially placed the Hasson cannula behind the loops of  bowel at mesentery. We were able to bluntly and sharply dissect down these  mesentery and omental attachments to the anterior abdominal wall, taking  care to obtain hemostasis with electrocautery. We were able to get down most  loops and  most of the omental defect into the midline defect. However in the  inferior portion of the abdomen where there were loops of small bowel. These  could not be removed without devascularizing and stripping the mesentery of  a very short segment of small bowel. This caused [place] to go ahead and open the  procedure.   We excised the widened part of the skin incision of the midline incision and  took it for the full length of the patient's previous laparotomy scar. We  then went down into the peritoneal cavity and subsequently dissected out  flaps circumferentially exposing the multiple defects in the anterior  fascia. These defective areas of fascia were excised and subsequently we  were able to close in multiple layers. Prior to closing we obtained adequate  hemostasis in the peritoneal cavity and  we did a very limited small bowel  resection of about 3 inches of small bowel with a primary anastomosis with a  GIA 55 stapler. The resulting enterotomy was closed using the TA-60. The  mesentery was closed using 3-0 silk sutures. Once this was done we were able  to irrigate with saline, obtained hemostasis and we did close in multiple  layers. The primary closure suture was a #1 Novofil that was run from the  superior to the midportion of the abdomen and from the area inferior to the  midportion of the abdomen. This was reinforced with intermittent figure-of-  eight stitches of #1 Novofil simple interrupted stitches. We then overlaid a  folded piece of 10 x 14 inch mesh, polypropylene mesh, on top of that,  securing it in place with #1 Novofil sutures, tacking it down  circumferentially around the previously closed midline fascial repair. We  irrigated with antibiotic solution and placed two flat 10-mm Blake drains in  the subcu on top of the mesh and then we closed the skin using stainless  steel staples.   All of previous laparoscopic sites were closed also with stainless steel  staples and we reinforced the closure of the Hassan cannula site with a  figure-of-eight stitch of whole Novofil. We placed the Blake drains to bulb  suctioned, applied a sterile dressing and then placed a binder on the  patient. Because of the small bowel repair, NG tube was placed and found to  be in adequate position.       JOW/MEDQ  D:  01/26/2005  T:  01/27/2005  Job:  [id number]

## 2010-12-10 NOTE — Discharge Summary (Signed)
NAME:  Bryan [name], Bryan [name] NO.:  [id number]   MEDICAL RECORD NO.:  [id number]          PATIENT TYPE:  INP   LOCATION:  4709                         FACILITY:  [facility]   PHYSICIAN:  Rene Paci, M.D. LHCDATE OF BIRTH:  11/13/1940   DATE OF ADMISSION:  10/21/2005  DATE OF DISCHARGE:  10/26/2005                                 DISCHARGE SUMMARY   DISCHARGE DIAGNOSES:  1.  Coronary artery disease, status post stent placement/cardiac      catheterization performed 10/24/2005.  2.  Iron deficiency anemia.  3.  Dyslipidemia.  4.  Acute gout exacerbation.   HISTORY OF PRESENT ILLNESS:  The patient is a 70 year old male, who  presented with a 2-3 day history of chest pain, which had worsen at time of  admission.  This was associated with positive shortness of breath and worse  with exertional activities.  The patient was admitted for further  evaluation.   PAST MEDICAL HISTORY:  1.  Coronary artery disease.  2.  History of anxiety.  3.  Hyperlipidemia.  4.  Hypertension.  5.  GERD.  6.  Diverticulitis.  7.  Uncontrolled diabetes type 2.  8.  History of nephrolithiasis.   COURSE OF HOSPITALIZATION:  1.  CAD, status post stent placement:  The patient is a 70 year old male,      who was admitted with initial complaints of chest pain.  Serial cardiac      enzymes were performed, which were negative however, because of      patient's history it was decided to go ahead with a cardiac cath, which      was performed on 10/24/2005.  The catheterization was performed by Dr.      Riley Kill.  Three lesions were noted with successful overlap of a non-drug-      eluting stent.  No restenosis at intermediate insertion site was noted.      It was recommended that the patient continue aspirin and Plavix for 1      year.  2.  Acute gouty exacerbation.  The patient was seen by Dr. Charlann Boxer as he      developed acute left knee pain.  A venous ultrasound was performed and      showed no  evidence of left lower extremity DVT or superficial      thrombosis.  The knee was aspirated by orthopedics and was noted to have      38,000 white blood cells which are not felt to be infection but positive      crystals were noted.  The patient was placed on colchicine.  It was      recommended that he be given NSAIDs as needed.  He also received a      cortisone/lidocaine injection and will need outpatient followup with      orthopedics.  He was noted on the day of discharge to have a white blood      cell count of 13.4, which is likely secondary to acute gout exacerbation      and steroid injections.  Of note, the patient's creatinine at time  of      discharge is 1.7.  Pre-cath his creatinine was 1.6 and has remained      unchanged 48 hours post catheterization.  He will need a repeatBMET and      CBC at his followup appointment with Dr. Alwyn Ren on Monday.  3.  Iron deficiency anemia:  Although patient's hemoglobin and hematocrit      were stable, he was noted to have a low iron level.  He will be started      on iron p.o. at discharge.   MEDICATIONS AT DISCHARGE:  1.  Aspirin 325 mg p.o. daily.  2.  Plavix 75 mg p.o. daily.  3.  Zocor 20 mg p.o. daily.  4.  Paxil 15 mg p.o. b.i.d.  5.  Benicar 20 p.o. daily.  6.  Atenolol 25 mg p.o. daily.  7.  Prilosec OCT as before.  8.  Tricor 145 mg p.o. q.h.s.  9.  Actos 45 mg p.o. daily.  10. Colchicine 0.6 mg p.o. b.i.d. as needed for gout.  11. Iron 325 mg p.o. daily.  12. Folic acid/vitamin C as before.   The patient is instructed to use ice and Motrin as needed for left knee  pain.   PERTINENT LABORATORY AT DISCHARGE:  Creatinine 1.7, BUN 22, white blood cell  count 13.4, hemoglobin 12.1, hematocrit k36.2.  Platelets 235.   FOLLOW UP:  The patient is scheduled to follow up with Dr. Marga Melnick on  Monday, October 31, 2005 at 2 p.m.  He is also instructed to follow up with Dr. Leslee Home.  The patient is also instructed to follow  up with Dr. Shelva Majestic of cardiology.      Melissa S. Peggyann Juba, NP      Rene Paci, M.D. [facility]  Electronically Signed    MSO/MEDQ  D:  10/26/2005  T:  10/27/2005  Job:  [id number]   cc:   Macarthur Critchley. Shelva Majestic, M.D.  Fax: [phone]   Titus Dubin. Alwyn Ren, M.D. [facility]  [phone] [place]  [place] [address]   Dr. Leslee Home

## 2010-12-10 NOTE — Discharge Summary (Signed)
[place]  Patient:    Bryan [name], Bryan [name]                     MRN: [id number] Adm. Date:  [id number] Disc. Date: [id number] Attending:  Ronaldo Miyamoto Dictator:   Joellyn Rued, P.A.C. CC:         Titus Dubin. Alwyn Ren, M.D. [facility]                  Referring Physician Discharge [place]  DATE OF BIRTH:  01/05/1941  SUMMARY OF HISTORY:  Bryan [name] is a 70 year old white male who describes longstanding intermittent chest discomfort with or without exertion since 1990.  He has seen Macarthur Critchley. Torelli, M.D., in the past for ERCP treatment since last May and has reported significant improvement in his symptoms for at least the preceding five to six months.  He describes symptoms typically associated with exertion, but may be exacerbated by it, associated with diaphoresis and radiation to the left upper extremity.  He denies any associated shortness of breath or nausea.  He describes the discomfort as a pressure similar to that prior to his bypass surgery.  He takes occasional nitroglycerin with relief in three to five minutes.  However, episodes have become daily and intermittent.  His history is notable for five-vessel bypass grafting 1991.  His last catheterization in March of 2001, revealed patent grafts with 50% multiple lesions in the saphenous vein graft to the PDA and circumflex and a 75% hazy PDA.  His EF was 79% and it was recommended that he have medical treatment at that time.  He has also had a history of cholecystectomy, appendectomy, depression, polypectomy, mild diverticulitis, and hyperlipidemia.  LABORATORY DATA:  Admission CKs and isoenzymes were negative for myocardial infarction.  Post procedure the hemoglobin was 15.5, hematocrit 42.7, normal indices, platelets 215, WBC 9.7, sodium 138, potassium 3.7, BUN 17, creatinine 1.5, and glucose 109.  The EKG showed normal sinus rhythm, possible inferior myocardial infarction, and sinus  bradycardia.  HOSPITAL COURSE:  Mr. Caul was admitted to the hospitalization.  A cardiac catheterization performed on September 18, 2000, by Arturo Morton. Riley Kill, M.D., according to the progress notes showed native three-vessel coronary artery disease.  The LIMA to the LAD was patent.  The saphenous vein graft to a diagonal branch and distal circumflex was patent.  There was noted to be a distal 70% lesion in the diagonal branch past the anastomosis.  The saphenous vein graft to the PDA and RCA and PDA and PLA of the circumflex showed 30% proximal, 60% mid, and 99% distal lesions.  In the sequential arm of this graft there was a 90% lesion.  Arturo Morton. Stuckey, M.D., performed angioplasty and stenting to the 60%, 99%, and 90% lesions.  Post sheath removal and bed rest, the patient did have several episodes of chest discomfort similar to preadmission.  EKGs did not show any changes.  Repeat enzymes did not show any elevation.  By September 20, 2000, he was ambulating without difficulty.  He did not have any further problems with chest discomfort.  Arturo Morton. Riley Kill, M.D., felt that he could be discharged home late the afternoon of September 20, 2000.  DISCHARGE DIAGNOSES: 1. Unstable angina. 2. Progressive coronary artery disease, status post angioplasty and stenting    as previously described.  DISPOSITION:  He is discharged home.  DISCHARGE MEDICATIONS:  He will be on Plavix 75 mg q.d. for four weeks.  Asked to  continue his home medications which include Imdur 30 mg q.d., Paxil 20 mg q.d., atenolol 50 mg q.d., coated aspirin 325 mg q.d., folic acid, vitamin C as previously, and sublingual nitroglycerin as needed.  DIET:  Maintain a low-salt, low-fat, and low-cholesterol diet.  WOUND CARE:  If he has any problems with his catheterization site, he is to call immediately.  FOLLOW-UP:  He will follow up with Arturo Morton. Riley Kill, M.D., on October 03, 2000, at 2:30 p.m. DD:  09/21/00 TD:   09/21/00 Job: [id number]

## 2010-12-10 NOTE — H&P (Signed)
NAME:  TYLIN, FORCE NO.:  [id number]   MEDICAL RECORD NO.:  [id number]                   PATIENT TYPE:  INP   LOCATION:  2905                                 FACILITY:  [facility]   PHYSICIAN:  Learta Codding, M.D.                 DATE OF BIRTH:  1940/11/23   DATE OF ADMISSION:  03/02/2003  DATE OF DISCHARGE:                                HISTORY & PHYSICAL   PRIMARY M.D.:  Dr. Alwyn Ren.   PRIMARY CARDIOLOGISTS:  1. Dr. Riley Kill.  2. Dr. Shelva Majestic.   CHIEF COMPLAINT:  Chest pain x2 days.   HISTORY OF PRESENT ILLNESS:  The patient is a 70 year old man with extensive  coronary history who presents with a two-day history of increasing chest  pain.  One day prior to admission he reports intermittent chest pain all day  long with occasional shortness of breath, nausea, vomiting, and diaphoresis.  The chest pain was reminiscent of his previous episodes of chest pain.  Today, on the day of admission, he had continued chest pain and took four  sublingual nitroglycerin throughout the day.  However, his chest pain  continued to recur intermittently.  The patient called the on-call physician  on the telephone and came to the ER.  While in the ER the patient had two  episodes of chest pain that relieved spontaneously.  The patient has  otherwise felt well in the last one to two months prior to admission.   PAST MEDICAL HISTORY:  1. Coronary artery disease.  The patient is status post CABG in January of     1991.  He had PCI in February 2000 of the first diagonal and the     saphenous vein graft to the PDA.  His last catheterization was January 11, 2002.  His left main was within normal limits.  His LAD was occluded.     His left circumflex was occluded after the OM 1 and a 70% lesion.  The     right coronary artery is occluded.  The LIMA was patent with a kink in     the artery causing a 40% stenosis.  The SVG to D1 is patent.  The first     diagonal itself is  small, and there is a 30-50% narrowing at the site of     a previous angioplasty.  The SVG to PDA is patent; however, there is a     30% narrowing proximally.  The stent of the SVG to PDA is patent.  The     patient is status post over 20 catheterizations.  2. Colectomy secondary to diverticulitis.  3. Appendectomy.  4. Cholecystectomy.  5. Hyperlipidemia.  6. Status post EECP.  7. History of gout.  8. History of depression.   SOCIAL HISTORY:  The patient lives in [place] with his wife.  No  alcohol.  No tobacco.  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   FAMILY HISTORY:  He says that everyone in his family has coronary artery  disease.  His brother died at age 88 with an acute MI.  His father died of  an acute MI.  His other brother also died of an acute MI.  His sister died  of complications of coronary artery disease.   ALLERGIES:  No known drug allergies.   CURRENT MEDICATIONS:  1. Aspirin 325 a day.  2. Zocor 20 mg q.h.s.  3. Paxil 30 mg daily.  4. Atenolol 50 mg daily.  5. Folate 1 mg daily.  6. Vitamin C 1000 mg daily.   REVIEW OF SYSTEMS:  Otherwise negative except as noted above in the history  of present illness.  As far as constitutional, HEENT, skin, pulmonary, GU,  neuropsychiatric, musculoskeletal, GI, and endocrine, all other systems are  negative.   PHYSICAL EXAMINATION:  VITAL SIGNS:  On admission the patient is afebrile,  pulse is 56, respirations 25, blood pressure 134/83.  GENERAL:  He is in no apparent distress.  Slightly obese man.  HEENT:  Normocephalic, atraumatic.  PERRLA.  EOMI.  NECK:  Supple.  No bruits.  With 2+ carotid pulses.  No JVD.  No  lymphadenopathy.  CARDIOVASCULAR:  Regular rate and rhythm.  No murmurs, rubs, or gallops.  PMI is difficult to palpate.  No S3.  LUNGS:  Clear to auscultation bilaterally.  No wheezes, rales, or rhonchi.  SKIN:  Within normal limits.  ABDOMEN:  Soft, nontender, nondistended.  No  hepatosplenomegaly.  No rebound  or guarding.  EXTREMITIES:  No cyanosis, clubbing, or edema.  NEUROLOGIC:  Intact.   LABORATORY DATA:  Chest x-ray is pending.   EKG shows a rate of 58 with a normal sinus rhythm.  No ST or T-wave changes.   Laboratories are pending.   ASSESSMENT AND PLAN:  The patient is a 70 year old white man, status post  coronary artery bypass graft for coronary artery disease, who presents with  unstable angina.  He has a history of a normal ejection fraction one year  ago and had stenting performed in 2002.   1. Admit to telemetry and rule out for MI.  2. Start Lovenox, heparin, and nitroglycerin drip.  3. Given his stenting in 2002, I would recommend a repeat catheterization,     especially given the seriousness and chronology of his current symptoms.     Certainly, he would require catheterization if his enzymes are positive.  4. Continue current medications and observe the patient overnight.      Heloise Beecham, M.D. [facility]                Learta Codding, M.D.    DWM/MEDQ  D:  03/03/2003  T:  03/03/2003  Job:  [id number]

## 2010-12-10 NOTE — H&P (Signed)
NAME:  OLUWATOBILOBA, MARTIN NO.:  [id number]   MEDICAL RECORD NO.:  [id number]          PATIENT TYPE:  INP   LOCATION:  3733                         FACILITY:  [facility]   PHYSICIAN:  Titus Dubin. Alwyn Ren, M.D. [facility] OF BIRTH:  June 28, 1941   DATE OF ADMISSION:  10/21/2005  DATE OF DISCHARGE:                                HISTORY & PHYSICAL   Bryan [name] is a 70 year old white [name] with a long cardiac history  which includes 23 catheterizations and four stents.  Admitted for one and a  half days of chest pain.  The chest pain is noted in the area of the left  breast and is sharp without radiation and without associated nausea or  diaphoresis.  It is not affected by position change or deep breathing but  occurs with exercise such as walking.  Bryan has had pain intermittently for  one and a half days but it has been progressive.  It has been associated  with shortness of breath.  Bryan called Dr. Elyn Aquas office and was referred  to the emergency room.  Instead, Bryan drove to my office for evaluation.   PAST MEDICAL HISTORY:  Long and complicated.  Bryan has had:  1.  Five-vessel bypass, the four stents, and 23 caths.  2.  Additionally, Bryan has had renal calculi x2.  3.  Bryan had hepatitis-related mononucleosis in 1996.  4.  Bryan has had an appendectomy.  5.  Bryan has also had ankle surgery in 1990 and 1997.  6.  In 2003, Bryan had syncope.  7.  Colonoscopy was negative in 2000.  8.  Bryan has had bowel surgery in 2002 and again in 2005.  9.  Bryan had hernia surgery at the operative site in 2006.  10. Bryan is followed by Dr. Lindie Spruce for residual seroma.  11. Bryan also had a cholecystectomy in 2004.   MEDICAL PROBLEMS:  1.  Diverticulosis.  2.  Colon polyps.  3.  Irritable bowel syndrome.  4.  Gastritis.   FAMILY HISTORY:  Positive for stroke in his mother.  A brother had coronary  artery disease and died at 54 of a myocardial infarction.  One brother died  of myocardial infarction at 35.  A  sister died from complications of bypass.   Bryan [name].  Bryan does not smoke or drink.   Bryan has no known drug allergies.   Bryan is on:  1.  Folic acid.  2.  Zocor 20.  3.  Enteric coated aspirin 325 mg daily.  4.  Paxil 30 mg one half twice a day for anxiety and panic attacks.  5.  Plavix 75 mg daily.  6.  Benicar 20 mg daily.  7.  Atenolol 50 mg one half daily.  8.  Hyoscyamine 0.125 mg as needed.  9.  Bryan takes Imodium AD as needed.  10. Clonazepam 0.5 as needed.  11. Reglan 5 mg as needed.  12. Darvocet as needed.   In addition to the chest pain, Bryan has had some flank pain which Bryan states is  the same level as  the previous kidney stone pain.  It was 7 or 8 on a 10  scale, the evening of March 29, 207 and 0 this morning.  It recurred today  at the office visit and was a level 4.  Bryan states that Bryan is on a moderate  diet.  Bryan is not on a regular exercise program.   PHYSICAL EXAMINATION:  VITAL SIGNS:  Weight was 245, pulse 68 and regular,  blood pressure 120/66.  HEENT:  Arteriolar narrowing was noted on fundal exam.  Otolaryngology exam  was unremarkable.  Thyroid was normal to palpation.  GENERAL:  Bryan appears somewhat disheveled but no acute distress.  CHEST:  Clear without splinting and without rubs.  An S4 with slurring  was  noted.  All pulses were intact.  Bryan had no edema.  Homan sign was negative.  ABDOMEN:  Reveals subcutaneous tissue deficit superiorly in both upper  quadrants.  There is suggestion of a ventral hernia in the area of the prior  surgeries.  GENITOURINARY:  Initially deferred because of the chest pain.  SKIN:  Warm and dry.  NEUROLOGIC:  His affect was somewhat unusual as Bryan described the chest pain  and the flank pain, his facial expression did not change.  This suggested  almost an [place] indifference.  While waiting for the ambulance, Bryan began  to pace and stated Bryan was fearful of having a panic attack.   Urinalysis revealed trace blood  and 250 of glucose.  This prompted a random  sugar which was 235.   1.  Bryan will be admitted to the hospital for cardiac enzymes serially.  2.  D-dimer will also be collected because of the shortness of breath.  3.  Sliding scale insulin will be provided because of the new onset      diabetes.  4.  Diabetic teaching will be initiated if possible during the      hospitalization.  5.  Cardiology will be consulted.      Titus Dubin. Alwyn Ren, M.D. [facility]  Electronically Signed     WFH/MEDQ  D:  10/21/2005  T:  10/22/2005  Job:  [id number]   cc:   Macarthur Critchley. Shelva Majestic, M.D.  Fax: [phone]   Cherylynn Ridges, M.D.  [address], [address]  [place]  [place] [address]

## 2010-12-10 NOTE — Discharge Summary (Signed)
[place]. [facility]  Patient:    Bryan [name], Bryan [name]                     MRN: [id number] Adm. Date:  [id number] Disc. Date: 10/09/99 Attending:  Ronaldo Miyamoto Dictator:   Lavella Hammock, P.A. CC:         Arturo Morton. Riley Kill, M.D. [facility]             Malcolm T. Pleas Koch., M.D. [facility]             Titus Dubin. Alwyn Ren, M.D. [facility]                           Discharge Summary  DATE OF BIRTH:  02-07-1941.  PROCEDURES: 1. Cardiac catheterization. 2. Coronary arteriogram. 3. Left ventriculogram.  HOSPITAL COURSE:  Mr. Streett is a 70 year old male patient of Dr. Riley Kill with a history of coronary artery disease and a total of 15 caths with bypass surgery n 1991.  He was seen in the emergency room on the day of admission for chest pain  that improved with three nitroglycerin.  He was admitted for further evaluation and to rule out MI.  His enzymes were negative for MI and he began having pain in the left inguinal area which was tender to touch, so a GI consult was called.  It was felt that he had acute diverticulitis and was started on IV antibiotics.  His abdominal pain gradually decreased on the antibiotics and he was felt stable for catheterization on October 08, 1999.  The cardiac catheterization showed limited AD patent to SVG to diagonal circumflex, patent with no restenosis at previous intervention site.  The SVG to PDA and circumflex showed progressive disease and poor runoff in the distal vessel with 75% hazy lesion of the small diffusely diseased PDA.  Dr. Riley Kill felt that the diffusely diseased SVG had poor long term patency and increased risk of periprocedural infarct; therefore, medical therapy was favored and he was placed on increased dose of Imdur at 30 mg a day.  The next day he was ambulatory without chest pain and considered stable for discharge on  October 09, 1999.  LABORATORY:  CT of abdomen and pelvis:  Inflammatory change in the  region of the sigmoid colon, most consistent with sigmoid diverticulitis.  Chest x-ray:  No acute process.  Hemoglobin 16.2, hematocrit 44.8, wbcs 9.7, platelets 219.  Sodium 141, potassium 3.7, chloride 105, CO2 28, BUN 14, creatinine 1.2, glucose 114, calcium 8.9.  Serial CK-MB and troponin I negative for MI.  Urinalysis within normal limits.  DISCHARGE CONDITION:  Improved.  CONSULTS:  [facility].  COMPLICATIONS:  None.  DISCHARGE DIAGNOSES: 1. Chest pain, medical therapy recommended. 2. History of aortocoronary bypass in 1991 with LIMA to LAD, SVG to diagonal and    circumflex, and SVG to PDA and circumflex. 3. Hyperlipidemia. 4. Depression. 5. Uncomplicated diverticulitis this admission. 6. Status post appendectomy and laparoscopic cholecystectomy, ankle surgery, and    colon polypectomy.  DISCHARGE INSTRUCTIONS:  ACTIVITY:  His activity is to include no driving for the next two days, and no strenuous activity to occlude via GI.  DIET:  He is to stick to a low residue, low fat diet.  FOLLOW-UP:  He is to follow up with Dr. Riley Kill and call for an appointment and he is to see Dr. Russella Dar on March 27.  He is to call the  office for bleeding, swelling, or drainage at the cath site.  DISCHARGE MEDICATIONS: 1. Ciprofloxacin 500 mg b.i.d. x 7 days. 2. Imdur 30 mg q.d. 3. Coated aspirin 325 mg q.d. 4. Norvasc 5 mg q.d. 5. Atenolol 50 mg q.d. 6. Lipitor 10 mg q.d. 7. Paxil 20 mg q.d. DD:  10/09/99 TD:  10/09/99 Job: [id number]

## 2010-12-10 NOTE — Op Note (Signed)
NAME:  KEISON, GLENDINNING NO.:  [id number]   MEDICAL RECORD NO.:  [id number]                   PATIENT TYPE:  INP   LOCATION:  6734                                 FACILITY:  [facility]   PHYSICIAN:  Abigail Miyamoto, M.D.              DATE OF BIRTH:  March 27, 1941   DATE OF PROCEDURE:  10/10/2003  DATE OF DISCHARGE:                                 OPERATIVE REPORT   PREOPERATIVE DIAGNOSIS:  Diverticulitis.   POSTOPERATIVE DIAGNOSIS:  Diverticulitis.   PROCEDURE:  Extended left hemicolectomy with takedown of splenic flexure.   SURGEON:  Abigail Miyamoto, M.D.   ASSISTANT:  Jimmye Norman, M.D.   ANESTHESIA:  General endotracheal anesthesia.   ESTIMATED BLOOD LOSS:  500 mL.   INDICATIONS:  Salem Lembke is a 70 year old gentleman who had undergone a  limited left sigmoid colectomy for diverticulitis approximately a year and a  half ago.  He has now had recurrent symptoms; therefore, a decision has been  made to proceed with an extended left hemicolectomy.   PROCEDURE IN DETAIL:  The patient was brought to the operating room and  identified as [name].  He was placed supine on the operating room  table and general anesthesia was induced.  His abdomen was then prepped and  draped in the usual sterile fashion.  Using a #10 blade a midline incision  was then created through the patient's previous incision.  The lower part of  the scar was completely excised.  Excision was carried down through the  fascia, which was then opened with the electrocautery.  The peritoneum was  then opened the entire length of the incision.  Several incisions seen on  the abdominal wall were then taken down with the electrocautery.  Several  adhesions to the small bowel were likewise taken down using the Metzenbaum  scissors.  The small bowel then had to be freed off of the left colon down  toward the pelvis, where the area of recurrent diverticulitis had been.  The  area of  diverticulosis where the patient had the previous inflammatory  changes was then noted.  Extensive dissection was carried out in order to  free the left colon off the pelvic sidewall.  An area of proximal rectum was  then identified and transected with the GIA-75 stapler.  The left colon was  then taken down along the white line of Toldt, going up to the splenic  flexure.  Extensive mobilization was then undertaken at the splenic flexure.  The splenic flexure was taken down with Kelly clamps as well as surgical  clips in order to free up the rest of the colon.  The omentum was then taken  down from the transverse colon and more mobilization was done to continue to  free up the splenic flexure.  The mesentery was then taken down on the  distal right colon more proximally.  After extensive mobilization was  performed,  the colon was transected proximally on the distal transverse  colon.  The specimen was sent to pathology for identification.  Further  mobilization had to be performed in order to allow the transverse colon to  extend down into the pelvis.  Once this was performed, a side-to-side  anastomosis was performed with a single firing of the GIA-75 stapler.  The  open end was then closed with skin staples.  After the abdomen was  irrigated, the anastomosis was examined and the proximal end of colon  appeared dusky.  At this point I was worried about the viability of the  colon.  The anastomosis was then taken down and the colon resected back  slightly further and the rectum was taken slightly more distally.  At this  point the hepatic flexure was then taken down and mobilized, mobilizing the  colon further.  Once more colon was mobilized, both ends appeared quite  viable.  At this point an end-to-end colorectal anastomosis was performed  with interrupted 3-0 silk sutures in a circumferential fashion.  At this  point a large anastomosis appeared to be created, and this time both ends   were viable with excellent blood flow.  The anastomosis appeared also to  have no tension.  The abdomen was then thoroughly irrigated with several  liters of normal saline.  Again the anastomosis was examined and to be  intact with good blood supply and without any tension or signs of vascular  compromise.  The midline fascia was then closed with a running #1 PDS suture  as well as interrupted #1 Novofil pop-off sutures.  The skin was then  irrigated and closed with skin staples.  The patient tolerated the procedure  well.  All counts were correct at the end of the procedure.  The patient was  then extubated in the operating room and taken in stable condition to the  recovery room.                                               Abigail Miyamoto, M.D.    DB/MEDQ  D:  10/10/2003  T:  10/13/2003  Job:  [id number]

## 2010-12-10 NOTE — Cardiovascular Report (Signed)
[facility]  Patient:    SOPHIA, CUBERO                     MRN: [id number] Proc. Date: 10/08/99 Adm. Date:  [id number] Disc. Date: [id number] Attending:  Ronaldo Miyamoto CC:         Arturo Morton. Riley Kill, M.D. [facility]             CV Laboratory                        Cardiac Catheterization  INDICATIONS:  Mr. Tye Maryland is a pleasant 70 year old, bell hopper, well known to me.  He had a prior bypass in 1991.  He now presents with recurrent angina.  He has also had diverticulitis.  The diverticulitis was treated with antibiotics and he is brought to the lab for further evaluation.  PROCEDURES:  Left heart catheterization, selective coronary angiography, selective left ventriculography, saphenous vein graft angiography, selective left internal mammary angiography.  DESCRIPTION OF PROCEDURE:  The procedure was performed from the right femoral artery using 6 French catheters.  He tolerated it without complication.  I called Dr. Juanda Chance into the lab to review the films with me and then I took additional views of the right coronary bypass.  There were no complications.  HEMODYNAMICS:  The central aortic pressure was 116/61. LV pressure /118/16.  No  gradient on pullback across the aortic valve.  ANGIOGRAPHIC DATA:  The left main coronary artery is free of critical disease.  The left anterior descending artery is totally occluded beyond the septal perforator.  The circumflex coronary artery is occluded beyond the origin of the small marginal. The small marginal itself was diffusely diseased with about a 70% area of focal  stenosis.  The internal mammary to the distal LAD is widely patent.  There is some diffuse  irregularity of the LAD itself.  The sequential saphenous vein graft to the diagonal and distal circumflex are widely patent.  There is no restenosis at the site of prior intervention.  The native right coronary artery was not  injected because it was known to be occluded from the previous catheterization study.  The saphenous vein graft to the distal right coronary is diffusely diseased. There are tandem lesions of 50% in the proximal and mid graft.  The distal graft inserts into the posterior descending and distal circumflex.  It inserts into a diffusely diseased small posterior descending and right at best insertion site, there is about 70-80% narrowing.  It also inserts into the posterolateral portion of the  circumflex vessel, but this vessel is somewhat diffusely diseased distally as well.  LEFT VENTRICULOGRAPHY:  Ventriculography in the RAO projection reveals minimal inferior hypokinesis.  Ejection fraction is 79%.  DISPOSITION:  The right vein bypass is severely diseased.  There has been progressive disease at its distal most aspect as it inserts into the PDA.  The likelihood of long-term vein graft patency is poor.  With intervention, it is possible that we could occlude the PDA as well as distally embolize the small posterolateral circumflex.  I think the likelihood of long-term durable result ith percutaneous intervention also is fairly poor.  We had a randomized the patient in the X-TRACT trial, but it will be my judgment that we should try medical therapy. In addition, he has active diverticulitis.  We will give a trial of medical therapy.  If this cannot be controlled on medicines then  we will consider the possibility of intervention. DD:  10/08/99 TD:  10/10/99 Job: [id number]

## 2011-02-07 ENCOUNTER — Encounter: Payer: Self-pay | Admitting: Internal Medicine

## 2011-03-18 ENCOUNTER — Other Ambulatory Visit: Payer: Self-pay | Admitting: Internal Medicine

## 2011-04-06 ENCOUNTER — Other Ambulatory Visit: Payer: Self-pay | Admitting: Internal Medicine

## 2011-04-22 ENCOUNTER — Ambulatory Visit (HOSPITAL_BASED_OUTPATIENT_CLINIC_OR_DEPARTMENT_OTHER)
Admission: RE | Admit: 2011-04-22 | Discharge: 2011-04-22 | Disposition: A | Discharge status: Home / Self Care | Payer: Medicare Other | Source: Ambulatory Visit | Attending: Internal Medicine | Admitting: Internal Medicine

## 2011-04-22 ENCOUNTER — Telehealth: Payer: Self-pay

## 2011-04-22 ENCOUNTER — Ambulatory Visit (INDEPENDENT_AMBULATORY_CARE_PROVIDER_SITE_OTHER): Payer: Medicare Other | Admitting: Internal Medicine

## 2011-04-22 ENCOUNTER — Encounter: Payer: Self-pay | Admitting: Internal Medicine

## 2011-04-22 VITALS — BP 124/80 | HR 65 | Temp 98.7°F | Wt 254.0 lb

## 2011-04-22 DIAGNOSIS — R0602 Shortness of breath: Secondary | ICD-10-CM

## 2011-04-22 DIAGNOSIS — M79604 Pain in right leg: Secondary | ICD-10-CM

## 2011-04-22 DIAGNOSIS — R0609 Other forms of dyspnea: Secondary | ICD-10-CM

## 2011-04-22 DIAGNOSIS — R0600 Dyspnea, unspecified: Secondary | ICD-10-CM

## 2011-04-22 DIAGNOSIS — Z23 Encounter for immunization: Secondary | ICD-10-CM

## 2011-04-22 DIAGNOSIS — M79609 Pain in unspecified limb: Secondary | ICD-10-CM

## 2011-04-22 DIAGNOSIS — I517 Cardiomegaly: Secondary | ICD-10-CM

## 2011-04-22 DIAGNOSIS — R079 Chest pain, unspecified: Secondary | ICD-10-CM

## 2011-04-22 DIAGNOSIS — M5430 Sciatica, unspecified side: Secondary | ICD-10-CM

## 2011-04-22 MED ORDER — GABAPENTIN 100 MG PO CAPS: 100.0000 mg | ORAL_CAPSULE | ORAL | Status: DC

## 2011-04-22 NOTE — Progress Notes (Signed)
Subjective:    Patient ID: Bryan [name], male    DOB: August 12, 1940, 70 y.o.   MRN: [id number]  HPI Extremity cramps Location:R & L  posterior legs from upper thigh  to ankle level Onset: 2 weeks ago Trigger/injury:no Pain quality: muscle spasms Pain severity:up to 6 Duration:hours Radiation:as  noted Exacerbating factors:? Related to meds as per wife's computer search; worse walking Treatment/response:none Review of systems: Constitutional: no fever, chills, sweats, change in weight  Musculoskeletal:no   joint stiffness, redness, or swelling Skin:no rash, color change Neuro: weakness/ "tiredness" in legs; no incontinence (stool/urine);no  numbness and tingling Heme:no lymphadenopathy;no  abnormal bruising or bleeding    Dyspnea: Onset:6 months intermittently Character: when sitting down or standing up for 10-15 seconds & with high level of exercise. No PNDyspnea Cough:dry cough Sputum production:no Hemoptysis:no Wheezing:yes Chest pain, edema, palpitations:no exertional cp Treatment/efficacy:wearing abdominal support. He had 7 "hernias" treated surgically in March of this year. He believes that one of the hernias was "pushing on his lungs". The notes from the surgeon were reviewed; he had a laparoscopic repair of incisional hernia with mesh.  .Use of rescue inhaler:no Use of maintenance inhaler:no Smoking:no Past medical history: Asthma:no; Allergies: no; Emphysema:no Family history pulmonary disease: brother had pneumonectomy for ?    Review of Systems   He just denies dysphagia, significant dyspepsia, or melena. He's had some hemorrhoidal bleeding     Objective:   Physical Exam Gen.: Healthy and well-nourished in appearance. Alert, appropriate and cooperative throughout exam.In no distress Head: Normocephalic without obvious abnormalities Eyes: No corneal or conjunctival inflammation noted.  Mouth: Oral mucosa and oropharynx reveal no lesions or exudates. Teeth in  good repair. Neck: No deformities, masses, or tenderness noted. Range of motion & . Thyroid thyroid. Lungs: Normal respiratory effort; chest expands symmetrically. Lungs are clear to auscultation without rales, wheezes, or increased work of breathing. Heart: Normal rate and rhythm. Normal S1 and S2. No gallop, click, or rub. S4; no  murmur. Abdomen: Bowel sounds normal; abdomen soft ; minimally tender RLQ. No masses, organomegaly or hernias noted.Op scars well healed                                                                                    Musculoskeletal/extremities: No deformity or scoliosis noted of  the thoracic or lumbar spine. No clubbing, cyanosis, edema, or deformity noted. Range of motion  Normal. Negative SLR .Tone & strength  normal.Joints normal. Nail health  Good. Post traumatic L index finger. Homans sign is negative bilaterally. Vascular: Carotid, radial artery, dorsalis pedis and  posterior tibial pulses are full and equal. No bruits present. Neurologic: Alert and oriented x3. Deep tendon reflexes symmetrical and normal. Gait is normal; he can walk on his heels and toes.         Skin: Intact without suspicious lesions or rashes. Lymph: No cervical, axillary  lymphadenopathy present. Psych: Mood and affect are normal. Normally interactive  Assessment & Plan:  #1 sciatica; no neuromuscular deficit present. Clinically there is no evidence of myalgia  #2 positional shortness of breath; hiatal hernia is suggested.  #3 coronary artery disease, status post and stents. A statin is critically needed prophylactically.   Plan: Gabapentin is recommended every 8 hours as needed for the sciatic symptoms. CK will be checked.  Chest x-ray will be completed.

## 2011-04-22 NOTE — Telephone Encounter (Signed)
Spoke with patient's wife, all information ok'd regarding Chest Xray

## 2011-04-22 NOTE — Patient Instructions (Signed)
Order for x-rays entered into  the computer; these will be performed at [facility]. No appointment is necessary.   If sciatica symptoms persist or progress; I recommend  physical therapy or chiropractic treatment.

## 2011-04-22 NOTE — Telephone Encounter (Signed)
Message copied by Edgardo Roys on Fri Apr 22, 2011  4:27 PM ------      Message from: Pecola Lawless      Created: Fri Apr 22, 2011  3:46 PM       Good; the chest Xray reveals no active cardiac or pulmonary process. The fact that position causes shortness of breath suggests hiatal hernia is playing a role.  [employer]

## 2011-05-03 LAB — PROTIME-INR
INR: 1
Prothrombin Time: 13.7
Prothrombin Time: 13.7

## 2011-05-03 LAB — CBC
Hemoglobin: 14.2
MCHC: 34.4
MCHC: 34.5
MCV: 88.8
MCV: 88.9
MCV: 89.7
Platelets: 221
Platelets: 241
RBC: 4.32
RBC: 4.63
WBC: 7.1
WBC: 7.7
WBC: 8.8

## 2011-05-03 LAB — CARDIAC PANEL(CRET KIN+CKTOT+MB+TROPI)
CK, MB: 1
Relative Index: INVALID
Total CK: 45
Troponin I: 0.02

## 2011-05-03 LAB — URINALYSIS, ROUTINE W REFLEX MICROSCOPIC
Bilirubin Urine: NEGATIVE
Glucose, UA: NEGATIVE
Hgb urine dipstick: NEGATIVE
Specific Gravity, Urine: 1.012
pH: 5.5

## 2011-05-03 LAB — DIFFERENTIAL
Basophils Absolute: 0
Lymphocytes Relative: 26
Monocytes Absolute: 0.6
Neutro Abs: 4.7

## 2011-05-03 LAB — BASIC METABOLIC PANEL
BUN: 32 — ABNORMAL HIGH
Calcium: 8.8
Chloride: 106
Creatinine, Ser: 1.83 — ABNORMAL HIGH
GFR calc Af Amer: 45 — ABNORMAL LOW

## 2011-05-03 LAB — COMPREHENSIVE METABOLIC PANEL
AST: 28
Albumin: 3.8
Albumin: 4.2
BUN: 30 — ABNORMAL HIGH
BUN: 31 — ABNORMAL HIGH
Chloride: 109
Creatinine, Ser: 1.81 — ABNORMAL HIGH
Creatinine, Ser: 1.91 — ABNORMAL HIGH
GFR calc Af Amer: 43 — ABNORMAL LOW
GFR calc non Af Amer: 38 — ABNORMAL LOW
Potassium: 4.1
Total Bilirubin: 0.7
Total Protein: 6.5

## 2011-05-03 LAB — APTT: aPTT: 28

## 2011-05-03 LAB — HEPARIN LEVEL (UNFRACTIONATED)
Heparin Unfractionated: 0.33
Heparin Unfractionated: 0.34
Heparin Unfractionated: <0.1 — ABNORMAL LOW

## 2011-05-03 LAB — CK TOTAL AND CKMB (NOT AT ARMC)
Relative Index: INVALID
Total CK: 28

## 2011-05-03 LAB — MAGNESIUM: Magnesium: 1.9

## 2011-05-04 ENCOUNTER — Other Ambulatory Visit: Payer: Self-pay | Admitting: Internal Medicine

## 2011-07-05 ENCOUNTER — Other Ambulatory Visit: Payer: Self-pay | Admitting: Internal Medicine

## 2011-09-13 ENCOUNTER — Encounter: Payer: Self-pay | Admitting: Internal Medicine

## 2011-09-13 ENCOUNTER — Ambulatory Visit (INDEPENDENT_AMBULATORY_CARE_PROVIDER_SITE_OTHER): Payer: Medicare Other | Admitting: Internal Medicine

## 2011-09-13 VITALS — BP 118/78 | HR 62 | Temp 97.9°F | Wt 253.0 lb

## 2011-09-13 DIAGNOSIS — M5414 Radiculopathy, thoracic region: Secondary | ICD-10-CM

## 2011-09-13 DIAGNOSIS — T887XXA Unspecified adverse effect of drug or medicament, initial encounter: Secondary | ICD-10-CM

## 2011-09-13 DIAGNOSIS — IMO0002 Reserved for concepts with insufficient information to code with codable children: Secondary | ICD-10-CM

## 2011-09-13 LAB — CREATININE, SERUM: Creatinine, Ser: 1.4 mg/dL (ref 0.4–1.5)

## 2011-09-13 MED ORDER — GABAPENTIN 100 MG PO CAPS: 100.0000 mg | ORAL_CAPSULE | ORAL | Status: DC

## 2011-09-13 NOTE — Progress Notes (Signed)
  Subjective:    Patient ID: Bryan [name], male    DOB: 09-22-1940, 71 y.o.   MRN: [id number]  HPI His Cardiologist, Dr. Beverely Pace, has ordered Neurontin 300 mg twice a day for chest wall pain clinically felt to be thoracic radicular pain based on cardiac catheterization findings January 21  Also  last week he had mid abdominal pressure type discomfort. This has resolved w/o treatment. He had had some rectal bleeding from his hemorrhoids. He denied significant dyspepsia, dysphagia, unexplained weight loss, or melena.  He denies any constipation or diarrhea; he has a past medical history of diverticulosis.    Review of Systems   Apparently his cardiologist wanted his kidney function rechecked because of the dye load with his catheterization. He denies dysuria, hematuria, pyuria or incontinence. He does have urgency.     Objective:   Physical Exam General appearance is one of good health and nourishment w/o distress.  Eyes: No conjunctival inflammation or scleral icterus is present.  Oral exam: Dental hygiene is good; lips and gums are healthy appearing.There is no oropharyngeal erythema or exudate noted.   Heart:  Normal rate and regular rhythm. S1 and S2 normal without gallop, murmur, click, rub .S4. Heart sounds distant  Lungs:Chest clear to auscultation; no wheezes, rhonchi,rales ,or rubs present.No increased work of breathing.   Abdomen: bowel sounds normal, soft and non-tender without masses,or  organomegaly . Op site hernia  Noted in mid line.  No guarding or rebound   Skin:Warm & dry.  Intact without suspicious lesions or rashes ; no jaundice or tenting  Lymphatic: No lymphadenopathy is noted about the head, neck, axilla areas.              Assessment & Plan:    #1 thoracic radicular pain. I appreciate Dr. Ledell Noss recommendation of Neurontin. Initially I would recommend a dose of milligrams every 8 hours with titration up to a maximum of 8 hours.  #2 abdominal pain,  probable irritable bowel variant, resolved  #3 contrast dye exposure; rule out renal insult  Plan: See orders

## 2011-09-13 NOTE — Patient Instructions (Signed)
To protect the kidneys it  is important to control your blood pressure and sugar. You should also stay well hydrated. Drink to thirst, up to 40 ounces of water a day.  Assess response to the gabapentin one every 8 hours as needed. If it is partially beneficial, it can be increased up to a total of 3 pills every 8 hours as needed. This increase of 1 pill each dose  should take place over 72 hours at least.

## 2011-09-19 ENCOUNTER — Other Ambulatory Visit: Payer: Self-pay | Admitting: Internal Medicine

## 2011-09-19 NOTE — Telephone Encounter (Signed)
272.4/995.20 Lipid

## 2011-09-27 ENCOUNTER — Telehealth: Payer: Self-pay | Admitting: *Deleted

## 2011-09-27 NOTE — Telephone Encounter (Signed)
Was referred to an urgent care

## 2011-09-27 NOTE — Telephone Encounter (Signed)
Triage Record Num: [id number] Operator: Peri Jefferson Patient Name: Bryan [name] Call Date & Time: 09/27/2011 4:17:00PM Patient Phone: [phone] PCP: Marga Melnick Patient Gender: Male PCP Fax : [phone] Patient DOB: Nov 22, 1940 Practice Name: [facility] Reason for Call: Caller: Olyn/Patient; PCP: Marga Melnick; CB#: [phone];Call regarding Cough/Congestion; Jeffree developed a cough on 09/25/11. States that he has coughed so much that it causes his chest and abdomen is sore. Also c/o lower right abdominal pain. Feels warm but has not taken temp. Denies difficulty breathing/wheezing. Productive cough - clear and yellow. Continuous cough does cause difficulty breathing. Utilized Cough - Adult Guideline. ED disposition. No appts available at office for today. Advised ED/UC. Protocol(s) Used: Cough - Adult Recommended Outcome per Protocol: See ED Immediately Reason for Outcome: Continuous cough causing difficulty breathing Care Advice: ~ Do not give the patient anything to eat or drink. Call EMS 911 if loss of consciousness, struggling to breathe, experiences new confusion or extreme drowsiness, change in skin color, or has chest pain or discomfort lasting 5 minutes or more. ~

## 2011-10-07 ENCOUNTER — Encounter: Payer: Self-pay | Admitting: Internal Medicine

## 2011-10-17 ENCOUNTER — Other Ambulatory Visit: Payer: Self-pay | Admitting: Internal Medicine

## 2011-10-17 NOTE — Telephone Encounter (Signed)
Refill done.  

## 2011-11-03 ENCOUNTER — Other Ambulatory Visit: Payer: Self-pay | Admitting: Internal Medicine

## 2011-11-18 ENCOUNTER — Other Ambulatory Visit: Payer: Self-pay | Admitting: Internal Medicine

## 2011-11-18 NOTE — Telephone Encounter (Signed)
Patient needs to schedule a CPX with fasting labs  

## 2011-11-22 ENCOUNTER — Telehealth: Payer: Self-pay | Admitting: Internal Medicine

## 2011-11-22 NOTE — Telephone Encounter (Signed)
Caller: Oden/Patient; PCP: Marga Melnick; CB#: [phone]; ; ; Call regarding Lower Abd Pain, Nausea;  onset around 11-15-11 of low and upper abdominal pain, pain center of back, nauseated and sweaty.   Denies chest pain, has hx of diverticulitis but this pain different.  He states pain is constant and is unbearable.  Per Abdominal Pain protocol emergent sx or over 50 yrs of age and new onset of unbearable back or abdominal pain identified   To call 911 to be transported to ED

## 2011-11-22 NOTE — Telephone Encounter (Signed)
Pt transfer to [facility] for triage.

## 2011-11-22 NOTE — Telephone Encounter (Signed)
Please call patient regarding stomach pains going into lower back Patient ph# [phone]

## 2011-12-01 ENCOUNTER — Encounter: Payer: Self-pay | Admitting: Internal Medicine

## 2011-12-01 ENCOUNTER — Ambulatory Visit (INDEPENDENT_AMBULATORY_CARE_PROVIDER_SITE_OTHER): Payer: Medicare Other | Admitting: Internal Medicine

## 2011-12-01 VITALS — BP 118/72 | HR 67 | Temp 98.0°F | Wt 252.0 lb

## 2011-12-01 DIAGNOSIS — N289 Disorder of kidney and ureter, unspecified: Secondary | ICD-10-CM

## 2011-12-01 DIAGNOSIS — K5792 Diverticulitis of intestine, part unspecified, without perforation or abscess without bleeding: Secondary | ICD-10-CM

## 2011-12-01 DIAGNOSIS — K5732 Diverticulitis of large intestine without perforation or abscess without bleeding: Secondary | ICD-10-CM

## 2011-12-01 NOTE — Patient Instructions (Signed)
BUN, creatinine, and GFR  all assess kidney function. To protect the kidneys it  is important to control your blood pressure and sugar. You should also stay well hydrated. Drink to thirst, up to 32 ounces of fluids per day.

## 2011-12-01 NOTE — Progress Notes (Signed)
  Subjective:    Patient ID: Bryan [name], male    DOB: Dec 11, 1940, 71 y.o.   MRN: [id number]  HPI He was seen in [facility] emergency room 11/22/2011 with abdominal pain across the midline with back radiation. CT scan did not reveal any significant pathologic process. The report is "no specific acute findings. Findings consistent with diarrheal type illness". He was described separate 05 mg twice a day and metronidazole 500 mg twice a day for 10 days. He has had significant improvement in his symptoms.  His white blood count was mildly elevated at 11,700. There was no anemia. Creatinine was 1.54, BUN 29, and GFR 44. Lipase was minimally elevated at 65  His past medical history is positive for colon polypectomy; he is unsure when he is due for followup with Dr. Russella Dar. He's also had diverticulosis. He knows of no definite family history of GI disease    Review of Systems  Present Symptoms: Nausea/Vomiting: no  Diarrhea: no  Constipation: no  Melena/BRBPR: hemorrhoidal bleeding intermittently  Hematemesis: no Anorexia: no  Fever/Chills: no  Dysuria/ hematuria/pyuria: no Wt loss: no         Objective:   Physical Exam General appearance :good nourishment w/o distress.  Eyes: No conjunctival inflammation or scleral icterus is present.  Oral exam: Dental hygiene is good; lips and gums are healthy appearing.There is no oropharyngeal erythema or exudate noted.   Heart:  Normal rate and regular rhythm. S1 and S2 normal without gallop, murmur, click, rub S 4      Lungs:Chest clear to auscultation; no wheezes, rhonchi,rales ,or rubs present.No increased work of breathing.   Abdomen: bowel sounds normal, soft and non-tender without masses, organomegaly or hernias noted.  No guarding or rebound   Skin:Warm but damp.  Intact without suspicious lesions or rashes ; no jaundice or tenting  Lymphatic: No lymphadenopathy is noted about the head, neck, axilla areas.               Assessment & Plan:  #1 abdominal pain in the context of known diverticulosis. He is improved dramatically with standard treatment for diverticulitis  #2 mild renal insufficiency  Plan: See recommendations. I will send a note to his gastroenterologist to see if he is due for followup colonoscopy. Obviously this would need to be deferred for at least 6-12 weeks after this acute episode

## 2011-12-02 ENCOUNTER — Telehealth: Payer: Self-pay

## 2011-12-02 NOTE — Telephone Encounter (Signed)
The patient's phone is busy I will continue to try and reach him

## 2011-12-02 NOTE — Telephone Encounter (Signed)
I have left the patient a message to contact me about scheduling a colonoscopy (per Dr Russella Dar and Dr Alwyn Ren)

## 2011-12-05 NOTE — Telephone Encounter (Signed)
Patient is on Plavix and is on tx for diverticulitis.  I have scheduled an office visit for him for 12/26/11 10:45

## 2011-12-09 ENCOUNTER — Telehealth: Payer: Self-pay | Admitting: Internal Medicine

## 2011-12-09 NOTE — Telephone Encounter (Signed)
Caller: Bryan [name]/Patient; PCP: Marga Melnick; CB#: [phone];  Call regarding Abdominal Pain; sx started 1 month ago; was seen in ER and dx with infected bowls; was seen in f/u with Dr Alwyn Ren; and he was not having any pain at the time of OV on 12/01/11; pt is now having abdominal pain again; sx started again around 12/03/11;  feels that he would like to have more antibiotic called in for him; pt has had a hx of DM in the past and diet controlled; constant pain;  Triaged per Abdominal Pain Guideline; Call Provider Immed d/t Abd pain and DM; offered appt for today but pt refused d/t transportation; office called and made aware; MD instructed that pt needs to be seen whether in office or in ER; if no transportation instructed to call 911; no med will be called in d/t pt needs evaluated; pt made aware of MD instructions; voiced understadning and offered no remarks of his intentions

## 2011-12-24 HISTORY — PX: CORONARY ANGIOPLASTY: SHX604

## 2011-12-26 ENCOUNTER — Telehealth: Payer: Self-pay | Admitting: Gastroenterology

## 2011-12-26 ENCOUNTER — Ambulatory Visit (INDEPENDENT_AMBULATORY_CARE_PROVIDER_SITE_OTHER): Payer: Medicare Other | Admitting: Gastroenterology

## 2011-12-26 ENCOUNTER — Encounter: Payer: Self-pay | Admitting: Gastroenterology

## 2011-12-26 VITALS — BP 134/62 | HR 60 | Ht 70.0 in | Wt 253.0 lb

## 2011-12-26 DIAGNOSIS — R109 Unspecified abdominal pain: Secondary | ICD-10-CM

## 2011-12-26 DIAGNOSIS — K921 Melena: Secondary | ICD-10-CM

## 2011-12-26 DIAGNOSIS — I2510 Atherosclerotic heart disease of native coronary artery without angina pectoris: Secondary | ICD-10-CM

## 2011-12-26 MED ORDER — GLYCOPYRROLATE 2 MG PO TABS: 2.0000 mg | ORAL_TABLET | Freq: Two times a day (BID) | ORAL | Status: DC

## 2011-12-26 MED ORDER — PEG-KCL-NACL-NASULF-NA ASC-C 100 G PO SOLR: 1.0000 | Freq: Once | ORAL | Status: DC

## 2011-12-26 NOTE — Patient Instructions (Addendum)
You have been scheduled for a colonoscopy with propofol. Please follow written instructions given to you at your visit today.  Please pick up your prep kit at the pharmacy within the next 1-3 days.  We have sent the following medications to your pharmacy for you to pick up at your convenience: Moviprep; you were given instructions at your visit today.  Robinul; take 1 tab twice daily for abdominal pain  We will contact you about your plavix, once we hear back from Dr. Beverely Pace

## 2011-12-26 NOTE — Progress Notes (Addendum)
History of Present Illness: This is a 71 year old male who has had persistent lower abdominal pain and a change in bowel habits. He was seen in [facility] emergency department in late April with lower bowel pain and diarrhea. CT scan was apparently unremarkable and he was treated with antibiotics for presumed infectious diarrhea. His diarrhea improved and now he has 2-4 formed stools each day. He has persistent mild to moderate lower abdominal pain that does not appear to change with meals or bowel movements. He has intermittent hematochezia with larger bowel movements for the past several weeks. He has a history of coronary artery disease and has multiple coronary artery stents. The last was placed in January 2013. Unfortunately do not have records from his prior colonoscopies available today.  Current Medications, Allergies, Past Medical History, Past Surgical History, Family History and Social History were reviewed in [name] record.  Physical Exam: General: Well developed , well nourished, no acute distress Head: Normocephalic and atraumatic Eyes:  sclerae anicteric, EOMI Ears: Normal auditory acuity Mouth: No deformity or lesions Lungs: Clear throughout to auscultation Heart: Regular rate and rhythm; no murmurs, rubs or bruits Abdomen: Soft, minimal lower abdominal tenderness without rebound or guarding and non distended. No masses, hepatosplenomegaly or hernias noted. Normal Bowel sounds Rectal: Deferred to colonoscopy Musculoskeletal: Symmetrical with no gross deformities  Pulses:  Normal pulses noted Extremities: No clubbing, cyanosis, edema or deformities noted Neurological: Alert oriented x 4, grossly nonfocal Psychological:  Alert and cooperative. Normal mood and affect  Assessment and Recommendations:  1. Lower abdominal pain, change in bowel habits, hematochezia. Rule out colorectal neoplasms, hemorrhoids, IBD. The risks, benefits and  alternatives to a 5 day hold of Plavix were discussed with the patient. Will need clearance from his cardiologist to see if Plavix can be safely held otherwise the procedure will need to be done on Plavix. The risks, benefits, and alternatives to colonoscopy with possible biopsy, possible destruction of internal hemorrhoids and possible polypectomy were discussed with the patient and they consent to proceed. He understands that we may not be able to remove larger polyps that he remains on Plavix due to the risk of bleeding.  2. Personal history of adenomatous colon polyps, 2007. Colonoscopy as above.  3. Coronary artery disease with multiple cardiac stents placed-last stent placed in January 2013  4. GERD. Symptoms controlled on daily omeprazole.

## 2011-12-26 NOTE — Telephone Encounter (Signed)
Faxed Anti-Coagulant letter to Dr. Beverely Pace at [facility].

## 2011-12-29 ENCOUNTER — Other Ambulatory Visit: Payer: Self-pay | Admitting: Internal Medicine

## 2012-01-11 NOTE — Telephone Encounter (Signed)
Spoke to Dr. Sharalyn Ink Nurse Amy, who told me because Pt. Is on a Drug eluding stent. And needs to stay on his plavix. I spoke to pt's wife and she said she will make sure he does.

## 2012-01-11 NOTE — Telephone Encounter (Signed)
Called Dr. Sharalyn Ink office regarding anti-coagulation letter. Spoke to Amy, refaxed anti-coagulation letter to her attention. Saying needed answer asap.

## 2012-01-18 ENCOUNTER — Encounter: Payer: Self-pay | Admitting: Gastroenterology

## 2012-01-18 ENCOUNTER — Ambulatory Visit (AMBULATORY_SURGERY_CENTER): Payer: Medicare Other | Admitting: Gastroenterology

## 2012-01-18 VITALS — BP 145/72 | HR 58 | Temp 95.2°F | Resp 15 | Ht 70.0 in | Wt 253.0 lb

## 2012-01-18 DIAGNOSIS — D126 Benign neoplasm of colon, unspecified: Secondary | ICD-10-CM

## 2012-01-18 DIAGNOSIS — R109 Unspecified abdominal pain: Secondary | ICD-10-CM

## 2012-01-18 DIAGNOSIS — K921 Melena: Secondary | ICD-10-CM

## 2012-01-18 DIAGNOSIS — R198 Other specified symptoms and signs involving the digestive system and abdomen: Secondary | ICD-10-CM

## 2012-01-18 MED ORDER — SODIUM CHLORIDE 0.9 % IV SOLN: 500.0000 mL | INTRAVENOUS | Status: DC

## 2012-01-18 NOTE — Op Note (Signed)
[facility] [address] [place], [place]  [address]  COLONOSCOPY PROCEDURE REPORT  PATIENT:  Bryan [name], Bryan [name]  MR#:  [id number] BIRTHDATE:  [birth year], 70 yrs. old  GENDER:  male ENDOSCOPIST:  Judie Petit T. Russella Dar, MD, [facility]  PROCEDURE DATE:  01/18/2012 PROCEDURE:  Colonoscopy with snare polypectomy ASA CLASS:  Class III INDICATIONS:  1) hematochezia  2) abdominal pain  3) change in bowel habits MEDICATIONS:   MAC sedation, administered by CRNA, propofol (Diprivan) 180 mg IV DESCRIPTION OF PROCEDURE:  After the risks benefits and alternatives of the procedure were thoroughly explained, informed consent was obtained.  Digital rectal exam was performed and revealed no abnormalities.  The LB [id number]-[id number] endoscope was introduced through the anus and advanced to the cecum, which was identified by both the appendix and ileocecal valve, without limitations. The quality of the prep was good, using MoviPrep. The instrument was then slowly withdrawn as the colon was fully examined. <<PROCEDUREIMAGES>> FINDINGS:  A sessile polyp was found in the proximal transverse colon. It was 5 mm in size. Polyp was snared without cautery. Retrieval was successful. Three polyps were found in the distal transverse colon. They were 5 - 6 mm in size. Polyps were snared without cautery. Retrieval was successful. Three polyps were found in the descending colon. They were 6 - 7 mm in size. Polyps were snared without cautery. Retrieval was successful. There was evidence of a prior segmental colectomy. in the sigmoid colon. Mild diverticulosis was found in the descending colon.  Otherwise normal colonoscopy without other polyps, masses, vascular ectasias, or inflammatory changes.  Retroflexed views in the rectum revealed internal hemorrhoids, small.  The time to cecum = 1  minutes. The scope was then withdrawn (time =  12  min) from the patient and the procedure completed.  COMPLICATIONS:   None  ENDOSCOPIC IMPRESSION: 1) 5 mm sessile polyp in the proximal transverse colon 2) 5 - 6 mm Three polyps in the distal transverse colon 3) 6 - 7 mm Three polyps in the descending colon 4) Prior segmental colectomy in the sigmoid colon 5) Mild diverticulosis in the descending colon 6) Internal hemorrhoids  RECOMMENDATIONS: 1) Await pathology results 2) High fiber diet with liberal fluid intake. 3) Repeat Colonoscopy in 3 years. 4) Continue glycopyrrolate bid  Edwardine Deschepper T. Russella Dar, MD, [facility]  n. eSIGNED:   Venita Lick. Clotiel Troop at 01/18/2012 02:44 PM  [name], [phone]

## 2012-01-18 NOTE — Patient Instructions (Addendum)
YOU HAD AN ENDOSCOPIC PROCEDURE TODAY AT THE [facility]: Refer to the procedure report that was given to you for any specific questions about what was found during the examination.  If the procedure report does not answer your questions, please call your gastroenterologist to clarify.  If you requested that your care partner not be given the details of your procedure findings, then the procedure report has been included in a sealed envelope for you to review at your convenience later.  YOU SHOULD EXPECT: Some feelings of bloating in the abdomen. Passage of more gas than usual.  Walking can help get rid of the air that was put into your GI tract during the procedure and reduce the bloating. If you had a lower endoscopy (such as a colonoscopy or flexible sigmoidoscopy) you may notice spotting of blood in your stool or on the toilet paper. If you underwent a bowel prep for your procedure, then you may not have a normal bowel movement for a few days.  DIET: Your first meal following the procedure should be a light meal and then it is ok to progress to your normal diet.  A half-sandwich or bowl of soup is an example of a good first meal.  Heavy or fried foods are harder to digest and may make you feel nauseous or bloated.  Likewise meals heavy in dairy and vegetables can cause extra gas to form and this can also increase the bloating.  Drink plenty of fluids but you should avoid alcoholic beverages for 24 hours.  ACTIVITY: Your care partner should take you home directly after the procedure.  You should plan to take it easy, moving slowly for the rest of the day.  You can resume normal activity the day after the procedure however you should NOT DRIVE or use heavy machinery for 24 hours (because of the sedation medicines used during the test).    SYMPTOMS TO REPORT IMMEDIATELY: A gastroenterologist can be reached at any hour.  During normal business hours, 8:30 AM to 5:00 PM Monday through Friday,  call [phone].  After hours and on weekends, please call the GI answering service at [phone] who will take a message and have the physician on call contact you.   Following lower endoscopy (colonoscopy or flexible sigmoidoscopy):  Excessive amounts of blood in the stool  Significant tenderness or worsening of abdominal pains  Swelling of the abdomen that is new, acute  Fever of 100F or higher    FOLLOW UP: If any biopsies were taken you will be contacted by phone or by letter within the next 1-3 weeks.  Call your gastroenterologist if you have not heard about the biopsies in 3 weeks.  Our staff will call the home number listed on your records the next business day following your procedure to check on you and address any questions or concerns that you may have at that time regarding the information given to you following your procedure. This is a courtesy call and so if there is no answer at the home number and we have not heard from you through the emergency physician on call, we will assume that you have returned to your regular daily activities without incident.  SIGNATURES/CONFIDENTIALITY: You and/or your care partner have signed paperwork which will be entered into your electronic medical record.  These signatures attest to the fact that that the information above on your After Visit Summary has been reviewed and is understood.  Full responsibility of the confidentiality   of this discharge information lies with you and/or your care-partner.     

## 2012-01-19 ENCOUNTER — Telehealth: Payer: Self-pay | Admitting: *Deleted

## 2012-01-19 ENCOUNTER — Other Ambulatory Visit: Payer: Self-pay | Admitting: Internal Medicine

## 2012-01-19 NOTE — Telephone Encounter (Signed)
LIPID 272.4/995.20

## 2012-01-19 NOTE — Telephone Encounter (Signed)
  Follow up Call-  Call back number 01/18/2012  Post procedure Call Back phone  # [phone]   Permission to leave phone message Yes     Patient questions:  Do you have a fever, pain , or abdominal swelling? no Pain Score  0 *  Have you tolerated food without any problems? yes  Have you been able to return to your normal activities? yes  Do you have any questions about your discharge instructions: Diet   no Medications  no Follow up visit  no  Do you have questions or concerns about your Care? no  Actions: * If pain score is 4 or above: No action needed, pain <4.

## 2012-01-29 ENCOUNTER — Encounter: Payer: Self-pay | Admitting: Gastroenterology

## 2012-02-16 ENCOUNTER — Other Ambulatory Visit: Payer: Self-pay | Admitting: Internal Medicine

## 2012-02-16 NOTE — Telephone Encounter (Signed)
Lipid 272.4 

## 2012-03-19 ENCOUNTER — Other Ambulatory Visit: Payer: Self-pay | Admitting: Internal Medicine

## 2012-03-19 DIAGNOSIS — T887XXA Unspecified adverse effect of drug or medicament, initial encounter: Secondary | ICD-10-CM

## 2012-03-19 DIAGNOSIS — E785 Hyperlipidemia, unspecified: Secondary | ICD-10-CM

## 2012-03-19 NOTE — Telephone Encounter (Signed)
Dr.Hopper please advise 

## 2012-03-19 NOTE — Telephone Encounter (Signed)
We do not have current labs to monitor this medication. That would include fasting lipids, hepatic panel, and CPK. Please see if these are being done by his cardiologist in [facility]. The cardiologist may be the ordering physician for this medication

## 2012-03-20 NOTE — Telephone Encounter (Signed)
Spoke with patient's wife, scheduled appointment for Thursday (fasting). Approved #30 with no refills until labs to be drawn, received, and reviewed.

## 2012-03-22 ENCOUNTER — Other Ambulatory Visit (INDEPENDENT_AMBULATORY_CARE_PROVIDER_SITE_OTHER): Payer: Medicare Other

## 2012-03-22 DIAGNOSIS — E785 Hyperlipidemia, unspecified: Secondary | ICD-10-CM

## 2012-03-22 DIAGNOSIS — T887XXA Unspecified adverse effect of drug or medicament, initial encounter: Secondary | ICD-10-CM

## 2012-03-22 LAB — HEPATIC FUNCTION PANEL
ALT: 18 U/L (ref 0–53)
Albumin: 4.3 g/dL (ref 3.5–5.2)
Total Bilirubin: 0.8 mg/dL (ref 0.3–1.2)

## 2012-03-22 LAB — CK: Total CK: 52 U/L (ref 7–232)

## 2012-03-22 LAB — LIPID PANEL
HDL: 41 mg/dL (ref >39.00)
Total CHOL/HDL Ratio: 4
Triglycerides: 196 mg/dL — ABNORMAL HIGH (ref 0.0–149.0)
VLDL: 39.2 mg/dL (ref 0.0–40.0)

## 2012-03-23 NOTE — Progress Notes (Signed)
Labs only

## 2012-03-28 ENCOUNTER — Other Ambulatory Visit: Payer: Self-pay

## 2012-03-28 MED ORDER — FENOFIBRIC ACID 135 MG PO CPDR: 135.0000 mg | DELAYED_RELEASE_CAPSULE | Freq: Every day | ORAL | Status: DC

## 2012-04-19 ENCOUNTER — Other Ambulatory Visit: Payer: Self-pay | Admitting: Internal Medicine

## 2012-04-19 NOTE — Telephone Encounter (Signed)
Rx sent.    MW 

## 2012-05-08 ENCOUNTER — Other Ambulatory Visit: Payer: Self-pay | Admitting: Internal Medicine

## 2012-05-08 ENCOUNTER — Ambulatory Visit (INDEPENDENT_AMBULATORY_CARE_PROVIDER_SITE_OTHER): Payer: Medicare Other

## 2012-05-08 DIAGNOSIS — Z23 Encounter for immunization: Secondary | ICD-10-CM

## 2012-05-18 ENCOUNTER — Other Ambulatory Visit: Payer: Self-pay | Admitting: Internal Medicine

## 2012-05-18 NOTE — Telephone Encounter (Signed)
Refill done.  

## 2012-07-05 ENCOUNTER — Encounter: Payer: Self-pay | Admitting: Internal Medicine

## 2012-07-05 ENCOUNTER — Ambulatory Visit (INDEPENDENT_AMBULATORY_CARE_PROVIDER_SITE_OTHER): Payer: Medicare Other | Admitting: Internal Medicine

## 2012-07-05 VITALS — BP 118/70 | HR 65 | Temp 98.2°F | Resp 12 | Ht 71.0 in | Wt 245.0 lb

## 2012-07-05 DIAGNOSIS — I10 Essential (primary) hypertension: Secondary | ICD-10-CM

## 2012-07-05 DIAGNOSIS — E785 Hyperlipidemia, unspecified: Secondary | ICD-10-CM

## 2012-07-05 DIAGNOSIS — Z23 Encounter for immunization: Secondary | ICD-10-CM

## 2012-07-05 DIAGNOSIS — D126 Benign neoplasm of colon, unspecified: Secondary | ICD-10-CM

## 2012-07-05 DIAGNOSIS — Z0000 Encounter for general adult medical examination without abnormal findings: Secondary | ICD-10-CM

## 2012-07-05 DIAGNOSIS — R7309 Other abnormal glucose: Secondary | ICD-10-CM

## 2012-07-05 DIAGNOSIS — L989 Disorder of the skin and subcutaneous tissue, unspecified: Secondary | ICD-10-CM

## 2012-07-05 DIAGNOSIS — I2510 Atherosclerotic heart disease of native coronary artery without angina pectoris: Secondary | ICD-10-CM

## 2012-07-05 LAB — CBC WITH DIFFERENTIAL/PLATELET
Basophils Absolute: 0 10*3/uL (ref 0.0–0.1)
Basophils Relative: 0.2 % (ref 0.0–3.0)
Eosinophils Absolute: 0.2 10*3/uL (ref 0.0–0.7)
HCT: 42.3 % (ref 39.0–52.0)
Hemoglobin: 14.5 g/dL (ref 13.0–17.0)
Lymphs Abs: 1.5 10*3/uL (ref 0.7–4.0)
MCHC: 34.3 g/dL (ref 30.0–36.0)
MCV: 87.2 fl (ref 78.0–100.0)
Neutro Abs: 6 10*3/uL (ref 1.4–7.7)
RBC: 4.85 Mil/uL (ref 4.22–5.81)
RDW: 14.3 % (ref 11.5–14.6)

## 2012-07-05 LAB — LIPID PANEL
Cholesterol: 151 mg/dL (ref 0–200)
HDL: 37.6 mg/dL — ABNORMAL LOW (ref >39.00)
LDL Cholesterol: 76 mg/dL (ref 0–99)
Total CHOL/HDL Ratio: 4
Triglycerides: 187 mg/dL — ABNORMAL HIGH (ref 0.0–149.0)
VLDL: 37.4 mg/dL (ref 0.0–40.0)

## 2012-07-05 LAB — BASIC METABOLIC PANEL
CO2: 24 mEq/L (ref 19–32)
Chloride: 104 mEq/L (ref 96–112)
Glucose, Bld: 118 mg/dL — ABNORMAL HIGH (ref 70–99)
Potassium: 3.9 mEq/L (ref 3.5–5.1)
Sodium: 137 mEq/L (ref 135–145)

## 2012-07-05 LAB — HEPATIC FUNCTION PANEL
Albumin: 4.5 g/dL (ref 3.5–5.2)
Total Protein: 8 g/dL (ref 6.0–8.3)

## 2012-07-05 LAB — HEMOGLOBIN A1C: Hgb A1c MFr Bld: 6.2 % (ref 4.6–6.5)

## 2012-07-05 MED ORDER — PAROXETINE HCL 30 MG PO TABS: ORAL_TABLET | ORAL | Status: DC

## 2012-07-05 NOTE — Patient Instructions (Addendum)
Please review the medication list in the After Visit Summary provided.Please write the name of the prescribing physician to the right of the medication and share this with all medical staff seen at each appointment. This will help provide continuity of care; help optimize therapeutic interventions;and help prevent drug:drug adverse reaction.   Eat a low-fat diet with lots of fruits and vegetables, up to 7-9 servings per day. Consume less than 40 grams of sugar per day from foods & drinks with High Fructose Corn Sugar as #1,2,3 or # 4 on label. Health Care Power of Attorney & Living Will. Complete if not in place ; these place you in charge of your health care decisions.  If you activate My Chart; the results can be released to you as soon as they populate from the lab. If you choose not to use this program; the labs have to be reviewed, copied & mailed   causing a delay in getting the results to you.

## 2012-07-05 NOTE — Progress Notes (Signed)
Subjective:    Patient ID: Bryan [name], male    DOB: 03/13/1941, 71 y.o.   MRN: [id number]  HPI Medicare Wellness Visit:  The following psychosocial & medical history were reviewed as required by Medicare.   Social history: caffeine: minimal , alcohol:  no ,  tobacco use : never  & exercise : no due to chest pain.   Home & personal  safety / fall risk: no issues, activities of daily living:no limitations , seatbelt use : yes , and smoke alarm employment : yes .  Power of Attorney/Living Will status : NO  Vision ( as recorded per Nurse) & Hearing  evaluation :  Ophth exam 8/13; no hearing exam. Orientation :oriented X 3 , memory & recall :good,  math testing: good,and mood & affect : good . Depression / anxiety: stable on meds Travel history : never out of [place] , immunization status :Shingles needed , transfusion history:  no, and preventive health surveillance ( colonoscopies, BMD , etc as per protocol/ SOC): up to date, Dental care:  Every 6 mos . Chart reviewed &  Updated. Active issues reviewed & addressed.       Review of Systems HYPERTENSION: Disease Monitoring: Blood pressure average-125/84  Chest pain, palpitations- ongoing pain assessed by Dr Beverely Pace; cath in 07/2012       Dyspnea- post 100 feet Medications: Compliance- yes  Lightheadedness,Syncope- some postural , positional related symptoms   Edema- no  FASTING HYPERGLYCEMIA, PMH of:  Disease Monitoring: Blood Sugar ranges- 110-134  Polyuria/phagia/dipsia- no      Visual problems- no Medications: Compliance- no meds; low carb, cardiac diet  Hypoglycemic symptoms- no  HYPERLIPIDEMIA: Disease Monitoring: See symptoms for Hypertension Medications: Compliance- yes Abd pain, bowel changes- low grade abdominal pain evaluated by Dr Russella Dar   Muscle aches- no  Since summer of this year he has noted recurrent papules over the trunk and extremities. He describes these as nonpleuritic. They do involve leaving granulomatous  and scarring changes. His wife as none of these. They  have into puppies.       Objective:   Physical Exam Gen.:  well-nourished in appearance. Alert, appropriate and cooperative throughout exam. Head: Normocephalic without obvious abnormalities;  pattern alopecia  Eyes: No corneal or conjunctival inflammation noted. Ptosis OD > OS.  Extraocular motion intact; but esotropia with superior gaze. Vision grossly normal. Ears: External  ear exam reveals no significant lesions or deformities. Canals clear .TMs normal. Hearing is grossly normal bilaterally. Nose: External nasal exam reveals no deformity or inflammation. Nasal mucosa are pink and moist. No lesions or exudates noted.  Mouth: Oral mucosa and oropharynx reveal no lesions or exudates. Teeth in good repair. Neck: No deformities, masses, or tenderness noted. Range of motion &. Thyroid **normal*. Lungs: Normal respiratory effort; chest expands symmetrically. Lungs are clear to auscultation without rales, wheezes, or increased work of breathing. Heart: Normal rate and rhythm. Normal S1 and S2. No gallop, click, or rub. S4 w/o murmur. Abdomen: Bowel sounds normal; abdomen soft and nontender. No masses, organomegaly or hernias noted. Genitalia: Genitalia normal except for left varices. Colonoscopy this year.  Musculoskeletal/extremities: No deformity or scoliosis noted of  the thoracic or lumbar spine. No clubbing, cyanosis, edema noted. Range of motion  normal .Tone & strength  Normal.Post traumatic L index finger changes. Nail health  good. Vascular: Carotid, radial artery, dorsalis pedis and  posterior tibial pulses are full and equal. No bruits present. Neurologic: Alert and oriented x3. Deep tendon reflexes symmetrical and normal.          Skin: Scattered truncal and extremity granulomatous and sclerotic skin lesions without evidence of active cellulitis. Lymph:  No cervical, axillary, or inguinal lymphadenopathy present. Psych: Mood and affect are normal. Normally interactive                                                                                         Assessment & Plan:  #1 Medicare Wellness Exam; criteria met ; data entered #2 Problem List reviewed ; Assessment/ Recommendations made Plan: see Orders

## 2012-08-30 ENCOUNTER — Other Ambulatory Visit (INDEPENDENT_AMBULATORY_CARE_PROVIDER_SITE_OTHER): Payer: Medicare Other

## 2012-08-30 DIAGNOSIS — I10 Essential (primary) hypertension: Secondary | ICD-10-CM

## 2012-08-30 LAB — BASIC METABOLIC PANEL
BUN: 32 mg/dL — ABNORMAL HIGH (ref 6–23)
Chloride: 106 mEq/L (ref 96–112)
Potassium: 4.1 mEq/L (ref 3.5–5.1)
Sodium: 140 mEq/L (ref 135–145)

## 2012-10-22 HISTORY — PX: CARDIAC CATHETERIZATION: SHX172

## 2012-10-26 ENCOUNTER — Encounter: Payer: Self-pay | Admitting: Internal Medicine

## 2012-10-26 ENCOUNTER — Ambulatory Visit (INDEPENDENT_AMBULATORY_CARE_PROVIDER_SITE_OTHER): Payer: Medicare Other | Admitting: Internal Medicine

## 2012-10-26 VITALS — BP 124/76 | HR 62 | Temp 98.0°F | Wt 250.0 lb

## 2012-10-26 DIAGNOSIS — R109 Unspecified abdominal pain: Secondary | ICD-10-CM

## 2012-10-26 DIAGNOSIS — N189 Chronic kidney disease, unspecified: Secondary | ICD-10-CM

## 2012-10-26 LAB — BASIC METABOLIC PANEL
CO2: 20 mEq/L (ref 19–32)
Calcium: 9.4 mg/dL (ref 8.4–10.5)
Chloride: 108 mEq/L (ref 96–112)
Glucose, Bld: 101 mg/dL — ABNORMAL HIGH (ref 70–99)
Potassium: 4.3 mEq/L (ref 3.5–5.3)
Sodium: 142 mEq/L (ref 135–145)

## 2012-10-26 NOTE — Addendum Note (Signed)
Addended byMarga Melnick F on: 10/26/2012 04:13 PM   Modules accepted: Orders

## 2012-10-26 NOTE — Assessment & Plan Note (Signed)
S/P catheterization 10/22/12. BMET ordered

## 2012-10-26 NOTE — Addendum Note (Signed)
Addended by: Silvio Pate D on: 10/26/2012 04:17 PM   Modules accepted: Orders

## 2012-10-26 NOTE — Assessment & Plan Note (Signed)
Referral to Dr Russella Dar as requested by Dr Beverely Pace

## 2012-10-26 NOTE — Patient Instructions (Addendum)
If you activate the  My Chart system; lab & Xray results will be released directly  to you as soon as I review & address these through the computer. If you choose not to sign up for My Chart within 36 hours of labs being drawn; results will be reviewed & interpretation added before being copied & mailed, causing a delay in getting the results to you.If you do not receive that report within 7-10 days ,please call. Additionally you can use this system to gain direct  access to your records  if  out of town or @ an office of a  physician who is not in  the My Chart network.  This improves continuity of care & places you in control of your medical record. Review and correct the record as indicated. Please share record with all medical staff seen. 

## 2012-10-26 NOTE — Progress Notes (Signed)
  Subjective:    Patient ID: Bryan [name], male    DOB: 02-Sep-1940, 72 y.o.   MRN: [id number]  HPI He underwent cardiac catheterization 10/22/12; this revealed only some blockages of 2 arteries along the posterior aspect of the heart. They could not be stented and Dr. Beverely Pace stated that open heart surgery would be extremely risky. He suggested that he anticipated collateral flow would develop &  compensate for these blockages.  A change was made in his anginal medications.      Review of Systems Obviously his renal function was checked at [facility]; we do not have copies of those records. There is also a concern with this additional catheterization which is approximately the 37th !!!  He has continued to have epigastric and lateral mid abdominal discomfort. Dr. Beverely Pace recommended followup with his gastroenterologist, Dr. Russella Dar.  He describes intermittent dysphagia; he had to have the Heimlich maneuver performed approximately a month ago by a policeman. He's lost 10 pounds in the last week. He denies melena or rectal bleeding.  He questions possible diverticulitis; he has not had bowel changes,fever, chills, or sweats     Objective:   Physical Exam General appearance : well nourished; w/o distress.  Eyes: No conjunctival inflammation or scleral icterus is present.  Oral exam: Dental hygiene is good; lips and gums are healthy appearing.There is some oropharyngeal crowded noted.   Heart:  Normal rate and regular rhythm. S1 and S2 normal without gallop, murmur, click, rub or other extra sounds     Lungs:Chest clear to auscultation; no wheezes, rhonchi,rales ,or rubs present.No increased work of breathing.   Abdomen: bowel sounds normal, soft and non-tender without masses, or organomegaly . Midline op site hernias noted.  No guarding or rebound   Skin:Warm & dry.  Intact without suspicious lesions or rashes ; no jaundice or tenting  Lymphatic: No lymphadenopathy is noted  about the head, neck, axilla.             Assessment & Plan:

## 2012-11-15 ENCOUNTER — Other Ambulatory Visit: Payer: Self-pay | Admitting: Internal Medicine

## 2012-11-23 ENCOUNTER — Other Ambulatory Visit: Payer: Self-pay | Admitting: Internal Medicine

## 2012-11-28 ENCOUNTER — Other Ambulatory Visit: Payer: Self-pay | Admitting: Internal Medicine

## 2012-11-28 NOTE — Telephone Encounter (Signed)
Pt spouse states the patient is completely out of this medication and that she requested it from the pharmacy last Friday. I advised her that we received it today and she has requested this be expedited.

## 2012-12-21 ENCOUNTER — Ambulatory Visit (INDEPENDENT_AMBULATORY_CARE_PROVIDER_SITE_OTHER): Payer: Medicare Other | Admitting: Gastroenterology

## 2012-12-21 ENCOUNTER — Encounter: Payer: Self-pay | Admitting: Gastroenterology

## 2012-12-21 VITALS — BP 130/80 | HR 78 | Ht 70.0 in | Wt 249.0 lb

## 2012-12-21 DIAGNOSIS — Z860100 Personal history of colon polyps, unspecified: Secondary | ICD-10-CM

## 2012-12-21 DIAGNOSIS — R079 Chest pain, unspecified: Secondary | ICD-10-CM

## 2012-12-21 DIAGNOSIS — K219 Gastro-esophageal reflux disease without esophagitis: Secondary | ICD-10-CM

## 2012-12-21 DIAGNOSIS — K589 Irritable bowel syndrome without diarrhea: Secondary | ICD-10-CM

## 2012-12-21 MED ORDER — PANTOPRAZOLE SODIUM 40 MG PO TBEC: 40.0000 mg | DELAYED_RELEASE_TABLET | Freq: Every day | ORAL | Status: DC

## 2012-12-21 NOTE — Patient Instructions (Addendum)
We have sent the following medications to your pharmacy for you to pick up at your convenience: Pantoprazole once daily long-term.   Patient advised to avoid spicy, acidic, citrus, chocolate, mints, fruit and fruit juices.  Limit the intake of caffeine, alcohol and Soda.  Don't exercise too soon after eating.  Don't lie down within 3-4 hours of eating.  Elevate the head of your bed.  Thank you for choosing me and [facility].  Venita Lick. Pleas Koch., MD., [facility]  Schedule a follow up with Dr. Alwyn Ren for chest pain and shortness of breath.

## 2012-12-21 NOTE — Progress Notes (Signed)
History of Present Illness: This is a 72 year old male who relates a frequent episodes of chest pain associated with exertion and anxiety. He also relates a dry cough for about the past 1-1/2 years. In addition he has episodic crampy abdominal pain felt secondary to irritable bowel syndrome which has responded fairly well to glycopyrrolate. He has chronic GERD with he is no longer on PPI and does not when this was discontinued.  Current Medications, Allergies, Past Medical History, Past Surgical History, Family History and Social History were reviewed in [name] record.  Physical Exam: General: Well developed , well nourished, no acute distress Head: Normocephalic and atraumatic Eyes:  sclerae anicteric, EOMI Ears: Normal auditory acuity Mouth: No deformity or lesions Lungs: Clear throughout to auscultation Heart: Regular rate and rhythm; no murmurs, rubs or bruits Abdomen: Soft, mild diffuse tenderness to light palpation and non distended. No masses, hepatosplenomegaly or hernias noted. Normal Bowel sounds Musculoskeletal: Symmetrical with no gross deformities  Pulses:  Normal pulses noted Extremities: No clubbing, cyanosis, edema or deformities noted Neurological: Alert oriented x 4, grossly nonfocal Psychological:  Alert and cooperative. Anxious.  Assessment and Recommendations:  1. IBS related abdominal pain and musculoskeletal abdominal pain. Continue glycopyrrolate bid.  2. Chest pain, cough. Possibly related to GERD but this really does not appear to be GI related. Follow up with Dr. Alwyn Ren. 3. Personal history of adenomatous colon polyps. Colonoscopy 12/2014.  4. Coronary artery disease with multiple cardiac stents placed-last stent placed in January 2013  5. GERD. Restart daily pantoprazole 40 mg. Remain on a PPI long-term and antireflux measures long-term. 6. Anxiety. Follow up with Dr. Alwyn Ren

## 2012-12-29 DIAGNOSIS — F411 Generalized anxiety disorder: Secondary | ICD-10-CM

## 2012-12-29 DIAGNOSIS — K219 Gastro-esophageal reflux disease without esophagitis: Secondary | ICD-10-CM

## 2012-12-29 DIAGNOSIS — I10 Essential (primary) hypertension: Secondary | ICD-10-CM

## 2012-12-29 DIAGNOSIS — I2510 Atherosclerotic heart disease of native coronary artery without angina pectoris: Secondary | ICD-10-CM

## 2012-12-29 HISTORY — DX: Gastro-esophageal reflux disease without esophagitis: K21.9

## 2012-12-29 HISTORY — DX: Atherosclerotic heart disease of native coronary artery without angina pectoris: I25.10

## 2012-12-29 HISTORY — DX: Generalized anxiety disorder: F41.1

## 2012-12-29 HISTORY — DX: Essential (primary) hypertension: I10

## 2013-01-05 ENCOUNTER — Other Ambulatory Visit: Payer: Self-pay | Admitting: Internal Medicine

## 2013-03-04 ENCOUNTER — Ambulatory Visit (HOSPITAL_BASED_OUTPATIENT_CLINIC_OR_DEPARTMENT_OTHER)
Admission: RE | Admit: 2013-03-04 | Discharge: 2013-03-04 | Disposition: A | Discharge status: Home / Self Care | Payer: Medicare Other | Source: Ambulatory Visit | Attending: Internal Medicine | Admitting: Internal Medicine

## 2013-03-04 ENCOUNTER — Ambulatory Visit (INDEPENDENT_AMBULATORY_CARE_PROVIDER_SITE_OTHER): Payer: Medicare Other | Admitting: Internal Medicine

## 2013-03-04 ENCOUNTER — Encounter: Payer: Self-pay | Admitting: Internal Medicine

## 2013-03-04 VITALS — BP 126/78 | HR 61 | Temp 98.5°F | Wt 246.0 lb

## 2013-03-04 DIAGNOSIS — R1030 Lower abdominal pain, unspecified: Secondary | ICD-10-CM

## 2013-03-04 DIAGNOSIS — R109 Unspecified abdominal pain: Secondary | ICD-10-CM

## 2013-03-04 DIAGNOSIS — R059 Cough, unspecified: Secondary | ICD-10-CM | POA: Insufficient documentation

## 2013-03-04 DIAGNOSIS — R05 Cough: Secondary | ICD-10-CM | POA: Insufficient documentation

## 2013-03-04 DIAGNOSIS — J209 Acute bronchitis, unspecified: Secondary | ICD-10-CM

## 2013-03-04 MED ORDER — AZITHROMYCIN 250 MG PO TABS: ORAL_TABLET | ORAL | Status: DC

## 2013-03-04 MED ORDER — RANITIDINE HCL 150 MG PO TABS: 150.0000 mg | ORAL_TABLET | Freq: Two times a day (BID) | ORAL | Status: AC

## 2013-03-04 MED ORDER — MONTELUKAST SODIUM 10 MG PO TABS: 10.0000 mg | ORAL_TABLET | Freq: Every day | ORAL | Status: DC

## 2013-03-04 NOTE — Progress Notes (Signed)
Subjective:    Patient ID: Bryan [name], male    DOB: 05-20-1941, 72 y.o.   MRN: [id number]  HPI  He describes a cough since January 2013. He is described as intermittent and dry. Cough syrup  has been of partial benefit. He is not on ACE inhibitor. He has never smoked and has no history of asthma.  Additionally he has had some bronchitic symptoms described as a "cold" for the last 6 days. This is associated with some clear sputum production.  He also has some constant " light" suprapubic abdominal discomfort. He's lost 16 pounds but this has been purposeful with portion control. He has intermittent rectal bleeding but his gastroenterologist, Dr. Russella Dar is aware of this issue. His colonoscopy is up-to-date; PMH of colon polyp & diverticulosis    Review of Systems  He denies extrinsic symptoms of itchy, watery eyes, or sneezing.  He also denies significant reflux symptoms; he has occasional dysphagia. He denies significant dyspepsia or melena.Cough no better with once daily PPI from Dr Russella Dar  He denies dysuria, hematuria, or pyuria. He does have nocturia at night.           Objective:   Physical Exam Gen.: Adequately nourished in appearance. Alert, appropriate and cooperative throughout exam.  Eyes: No corneal or conjunctival inflammation noted. No icterus Ears: External  ear exam reveals no significant lesions or deformities. Canals clear .TMs normal. Hearing is grossly normal bilaterally. Nose: External nasal exam reveals no deformity or inflammation. Nasal mucosa are dry w/o lesions. No lesions or exudates noted.   Mouth: Oral mucosa and oropharynx reveal no lesions or exudates. Teeth in good repair. Neck: No deformities, masses, or tenderness noted.  Lungs: Normal respiratory effort; chest expands symmetrically. Lungs are clear to auscultation without rales, wheezes, or increased work of breathing. Brassy nonproductive cough Heart: Normal rate and rhythm. Normal S1 and S2.  No gallop, click, or rub. Flow murmur. Abdomen: Bowel sounds normal; abdomen soft and nontender. No masses, organomegaly or hernias noted.                                  Musculoskeletal/extremities: Slightly accentuated curvature of upper thoracic  Spine. No clubbing, cyanosis, edema, or significant extremity  deformity noted. Joints normal. Nail health good. Able to lie down & sit up w/o help.  Vascular: Carotid, radial artery, dorsalis pedis and  posterior tibial pulses are full and equal. No bruits present. Neurologic: Alert and oriented x3.        Skin: Intact without suspicious lesions or rashes. Lymph: No cervical, axillary lymphadenopathy present. Psych: Mood and affect are normal. Normally interactive                                                                                        Assessment & Plan:  #1 cough, protracted since January 2013. Laryngo esophageal reflux still must ruled out. It did not respond to pantoprazole once daily. Plavix precludes PPI @ high dose  #2 suprapubic discomfort with a past history of diverticulosis; this would be an atypical presentation of diverticulitis  #  3 acute bronchitis  Plan see orders and recommendations

## 2013-03-04 NOTE — Patient Instructions (Addendum)
Order for x-rays entered into  the computer; these will be performed at [facility]. No appointment is necessary.   If symptoms persist and studies are nondiagnostic; pulmonary function test and referral for possible evaluation of laryngoesophageal reflux would be recommended.

## 2013-03-05 LAB — CBC WITH DIFFERENTIAL/PLATELET
Basophils Absolute: 0 10*3/uL (ref 0.0–0.1)
Eosinophils Absolute: 0.4 10*3/uL (ref 0.0–0.7)
Hemoglobin: 13.7 g/dL (ref 13.0–17.0)
Lymphocytes Relative: 15.1 % (ref 12.0–46.0)
MCHC: 33.3 g/dL (ref 30.0–36.0)
Monocytes Relative: 10.8 % (ref 3.0–12.0)
Neutro Abs: 5.3 10*3/uL (ref 1.4–7.7)
Neutrophils Relative %: 69.3 % (ref 43.0–77.0)
Platelets: 207 10*3/uL (ref 150.0–400.0)
RDW: 14 % (ref 11.5–14.6)

## 2013-05-17 ENCOUNTER — Other Ambulatory Visit: Payer: Self-pay | Admitting: Internal Medicine

## 2013-05-20 ENCOUNTER — Other Ambulatory Visit: Payer: Self-pay | Admitting: *Deleted

## 2013-05-20 MED ORDER — LOSARTAN POTASSIUM 50 MG PO TABS: ORAL_TABLET | ORAL | Status: DC

## 2013-05-20 NOTE — Telephone Encounter (Signed)
Losartan refill sent to pharmacy 

## 2013-05-23 ENCOUNTER — Other Ambulatory Visit: Payer: Self-pay | Admitting: Internal Medicine

## 2013-05-23 NOTE — Telephone Encounter (Signed)
Choline Fenofibrate refill sent to pharmacy

## 2013-05-30 ENCOUNTER — Other Ambulatory Visit: Payer: Self-pay

## 2013-05-31 ENCOUNTER — Other Ambulatory Visit: Payer: Self-pay | Admitting: Internal Medicine

## 2013-05-31 NOTE — Telephone Encounter (Signed)
Crestor refill sent to pharamcy

## 2013-07-06 ENCOUNTER — Other Ambulatory Visit: Payer: Self-pay | Admitting: Internal Medicine

## 2013-07-08 NOTE — Telephone Encounter (Signed)
Rx sent to the pharmacy by e-script.  Pt needs office visit for CPE.//AB/CMA 

## 2013-08-02 ENCOUNTER — Other Ambulatory Visit: Payer: Self-pay | Admitting: Internal Medicine

## 2013-08-05 NOTE — Telephone Encounter (Signed)
Paroxetine refilled per protocol. JG//CMA

## 2013-08-20 ENCOUNTER — Telehealth: Payer: Self-pay

## 2013-08-20 NOTE — Telephone Encounter (Signed)
Medication List and allergies:  Reviewed and updated  90 day supply/mail order: na Local prescriptions: [facility] [address] [employer]  Immunizations due: shingles (needs to see if insurance will pay for)  A/P:   No changes to FH, PSH. Personal hx--bout with kidney stones and enlarged prostate.  Flu vaccine--04/2013 Tdap--06/2012 PNA--06/2008 Shingles--due CCS--12/2011--Dr Stark--adenomatous polyps PSA--none noted current  To Discuss with Provider: Insurance will only pay for one Losartan pill per day/current Rx is for 50mg  bid Kidney stones---enlarged prostate

## 2013-08-21 ENCOUNTER — Ambulatory Visit (INDEPENDENT_AMBULATORY_CARE_PROVIDER_SITE_OTHER): Payer: Medicare Other | Admitting: Internal Medicine

## 2013-08-21 ENCOUNTER — Encounter: Payer: Self-pay | Admitting: Internal Medicine

## 2013-08-21 VITALS — BP 102/48 | HR 67 | Temp 98.7°F | Ht 71.0 in | Wt 251.8 lb

## 2013-08-21 DIAGNOSIS — Z0000 Encounter for general adult medical examination without abnormal findings: Secondary | ICD-10-CM

## 2013-08-21 DIAGNOSIS — D126 Benign neoplasm of colon, unspecified: Secondary | ICD-10-CM

## 2013-08-21 DIAGNOSIS — E782 Mixed hyperlipidemia: Secondary | ICD-10-CM

## 2013-08-21 DIAGNOSIS — R079 Chest pain, unspecified: Secondary | ICD-10-CM | POA: Insufficient documentation

## 2013-08-21 LAB — CBC WITH DIFFERENTIAL/PLATELET
BASOS PCT: 0.6 % (ref 0.0–3.0)
Basophils Absolute: 0 10*3/uL (ref 0.0–0.1)
EOS PCT: 3.6 % (ref 0.0–5.0)
Eosinophils Absolute: 0.2 10*3/uL (ref 0.0–0.7)
HEMATOCRIT: 40.8 % (ref 39.0–52.0)
Hemoglobin: 13.7 g/dL (ref 13.0–17.0)
LYMPHS ABS: 0.7 10*3/uL (ref 0.7–4.0)
Lymphocytes Relative: 11.1 % — ABNORMAL LOW (ref 12.0–46.0)
MCHC: 33.6 g/dL (ref 30.0–36.0)
MCV: 90.6 fl (ref 78.0–100.0)
MONO ABS: 1 10*3/uL (ref 0.1–1.0)
MONOS PCT: 15.4 % — AB (ref 3.0–12.0)
Neutro Abs: 4.3 10*3/uL (ref 1.4–7.7)
Neutrophils Relative %: 69.3 % (ref 43.0–77.0)
Platelets: 143 10*3/uL — ABNORMAL LOW (ref 150.0–400.0)
RBC: 4.5 Mil/uL (ref 4.22–5.81)
RDW: 14.9 % — ABNORMAL HIGH (ref 11.5–14.6)
WBC: 6.3 10*3/uL (ref 4.5–10.5)

## 2013-08-21 LAB — BASIC METABOLIC PANEL
BUN: 23 mg/dL (ref 6–23)
CHLORIDE: 109 meq/L (ref 96–112)
CO2: 23 mEq/L (ref 19–32)
Calcium: 9.1 mg/dL (ref 8.4–10.5)
Creatinine, Ser: 1.4 mg/dL (ref 0.4–1.5)
GFR: 51.38 mL/min — ABNORMAL LOW (ref >60.00)
Glucose, Bld: 99 mg/dL (ref 70–99)
POTASSIUM: 4.1 meq/L (ref 3.5–5.1)
SODIUM: 139 meq/L (ref 135–145)

## 2013-08-21 LAB — LIPID PANEL
CHOLESTEROL: 126 mg/dL (ref 0–200)
HDL: 36.3 mg/dL — ABNORMAL LOW (ref >39.00)
LDL CALC: 52 mg/dL (ref 0–99)
TRIGLYCERIDES: 187 mg/dL — AB (ref 0.0–149.0)
Total CHOL/HDL Ratio: 3
VLDL: 37.4 mg/dL (ref 0.0–40.0)

## 2013-08-21 LAB — HEPATIC FUNCTION PANEL
ALBUMIN: 4.1 g/dL (ref 3.5–5.2)
ALK PHOS: 48 U/L (ref 39–117)
ALT: 23 U/L (ref 0–53)
AST: 31 U/L (ref 0–37)
Bilirubin, Direct: 0 mg/dL (ref 0.0–0.3)
TOTAL PROTEIN: 7.3 g/dL (ref 6.0–8.3)
Total Bilirubin: 0.8 mg/dL (ref 0.3–1.2)

## 2013-08-21 LAB — TSH: TSH: 1.95 u[IU]/mL (ref 0.35–5.50)

## 2013-08-21 LAB — CK TOTAL AND CKMB (NOT AT ARMC)
CK, MB: 0.8 ng/mL (ref 0.0–5.0)
Total CK: 38 U/L (ref 7–232)

## 2013-08-21 LAB — TROPONIN I: TROPONIN I: 0.02 ng/mL (ref <0.06)

## 2013-08-21 NOTE — Progress Notes (Signed)
Subjective:    Patient ID: Bryan [name], male    DOB: [birth year], 73 y.o.   MRN: [id number]  HPI Medicare Wellness Visit: Psychosocial and medical history were reviewed as required by Medicare (history related to abuse, antisocial behavior , firearm risk). Social history:  Alcohol: no , Tobacco YIR:SWNIO Exercise:no regular exercise Personal safety/fall risk:no Limitations of activities of daily living:no Seatbelt/ smoke alarm use:yes Healthcare Power of Attorney/Living Will status: needed Ophthalmologic exam status:to be scheduled Hearing evaluation status:not current Orientation: Oriented X 3 Memory and recall: good Math testing: good Depression/anxiety assessment: no Foreign travel history:never Immunization status for influenza/pneumonia/ shingles /tetanus:Shingles needed Transfusion history:no Preventive health care maintenance status: Colonoscopy as per protocol/standard care:as per Dr Fuller Plan, PMH of tubular adenoma Dental care:every 6 mos Chart reviewed and updated. Active issues reviewed and addressed as documented below.    Review of Systems   While waiting in the lobby before his visit  he had chest pain which is typical of the recurrent chest pains in the past. He did take a nitroglycerin. This episode was not associated with dyspnea or diaphoresis.Stress is a trigger for the chest pain. He does describe some claudication in the right lower extremity intermittently. He denies palpitations. Heart healthy diet is not followed; exercise encompasses infrequent walking, chest pain with exertion.  Family history is positive for premature coronary disease.  With CAD  LDL goal is less than  70 . There is medication compliance with the statin.  Low dose ASA taken Specifically denied are palpitations. Significant abdominal symptoms, memory deficit, or myalgias not present.     Objective:   Physical Exam Gen.:  well-nourished in appearance. Central weight excess Alert,  appropriate and cooperative throughout exam.  Head: Normocephalic without obvious abnormalities;  pattern alopecia  Eyes: No corneal or conjunctival inflammation noted. Pupils equal round reactive to light and accommodation. Extraocular motion intact.  Ears: External  ear exam reveals no significant lesions or deformities. L canal clear; R canal cerumen noted.TMs normal. Hearing is grossly normal bilaterally. Nose: External nasal exam reveals no deformity or inflammation. Nasal mucosa are pink and moist. No lesions or exudates noted. Septum slight R deviation. Mouth: Oral mucosa and oropharynx reveal no lesions or exudates. Teeth in good repair. Neck: No deformities, masses, or tenderness noted. Range of motion WNL. Thyroid palpable. Lungs: Normal respiratory effort; chest expands symmetrically. Lungs are clear to auscultation without rales, wheezes, or increased work of breathing. Heart: Normal rate and rhythm. Normal S1 and S2. No gallop, click, or rub. Slurred S4 noted. Abdomen: Bowel sounds normal; abdomen soft and nontender. No masses, organomegaly or hernias noted. Genitalia:as per Dr Thomasene Mohair Musculoskeletal/extremities: No deformity or scoliosis noted of  the thoracic or lumbar spine.  No clubbing, cyanosis, edema, or significant extremity  deformity noted. Range of motion normal .Tone & strength normal. Hand joints normal. Fingernail health normal. Able to lie down & sit up w/o help. Negative SLR bilaterally Vascular: Carotid, radial artery, dorsalis pedis and  posterior tibial pulses are full and equal. No bruits present. Neurologic: Alert and oriented x3. Deep tendon reflexes symmetrical and normal.  Gait normal.       Skin: Intact without suspicious lesions or rashes.2 X 3 cm vitigo upper back Lymph: No cervical, axillary  lymphadenopathy present. Psych: Mood and affect are normal. Normally interactive  Assessment & Plan:  #1 Medicare Wellness Exam; criteria met ; data entered #2 Problem List/Diagnoses reviewed #3 acute chest pain Plan:  Assessments made/ Orders entered Note: EKG , no change since 07/05/2012

## 2013-08-21 NOTE — Progress Notes (Signed)
Pre visit review using our clinic review tool, if applicable. No additional management support is needed unless otherwise documented below in the visit note. 

## 2013-08-21 NOTE — Patient Instructions (Addendum)
Your next office appointment will be determined based upon review of your pending labs . Those instructions will be transmitted to you through My Chart  .  Followup as needed for your this acute issue. Please report any significant change in your symptoms. Share results with all non [facility] medical staff seen

## 2013-09-04 ENCOUNTER — Telehealth: Payer: Self-pay | Admitting: Internal Medicine

## 2013-09-04 NOTE — Telephone Encounter (Signed)
   He has severe coronary disease and has had multiple catheterizations. Crestor is mandatory as other medications are not strong enough to control his cardiovascular risk of recurrent angina or infarct.  He should obtain a statement from his cardiologist attesting to these facts

## 2013-09-04 NOTE — Telephone Encounter (Signed)
Patient's wife called stating the patient's Crestor rx went up to almost $200 per month. They are asking for an alternative. [facility] lipitor. [facility] [address] [facility] [place]

## 2013-09-06 NOTE — Telephone Encounter (Signed)
Spoke with the pt's wife and informed her of Dr. Clayborn Heron recommendation below.  The wife understood and stated okay.//AB/CMA

## 2013-11-06 ENCOUNTER — Other Ambulatory Visit: Payer: Self-pay

## 2013-11-06 MED ORDER — PAROXETINE HCL 30 MG PO TABS: ORAL_TABLET | ORAL | Status: DC

## 2013-11-06 NOTE — Telephone Encounter (Signed)
Last OV 08/21/13  Med last filled 08/05/13 #60 plus 2 refills

## 2013-11-20 ENCOUNTER — Other Ambulatory Visit: Payer: Self-pay

## 2013-11-20 MED ORDER — CHOLINE FENOFIBRATE 135 MG PO CPDR: 135.0000 mg | DELAYED_RELEASE_CAPSULE | Freq: Every day | ORAL | Status: DC

## 2013-11-22 DIAGNOSIS — S81809A Unspecified open wound, unspecified lower leg, initial encounter: Secondary | ICD-10-CM

## 2013-11-22 DIAGNOSIS — S81009A Unspecified open wound, unspecified knee, initial encounter: Secondary | ICD-10-CM

## 2013-11-22 HISTORY — DX: Unspecified open wound, unspecified lower leg, initial encounter: S81.809A

## 2013-11-22 HISTORY — DX: Unspecified open wound, unspecified knee, initial encounter: S81.009A

## 2013-11-27 DIAGNOSIS — T79A29A Traumatic compartment syndrome of unspecified lower extremity, initial encounter: Secondary | ICD-10-CM

## 2013-11-27 HISTORY — DX: Traumatic compartment syndrome of unspecified lower extremity, initial encounter: T79.A29A

## 2013-12-03 ENCOUNTER — Other Ambulatory Visit: Payer: Self-pay

## 2013-12-03 MED ORDER — ROSUVASTATIN CALCIUM 40 MG PO TABS: ORAL_TABLET | ORAL | Status: DC

## 2014-01-27 ENCOUNTER — Other Ambulatory Visit: Payer: Self-pay | Admitting: Internal Medicine

## 2014-01-27 NOTE — Telephone Encounter (Signed)
OK X1 Additional refills will need to be Rxed by his new PCP( records transferred)

## 2014-03-15 ENCOUNTER — Encounter: Payer: Self-pay | Admitting: Gastroenterology

## 2014-03-27 ENCOUNTER — Telehealth: Payer: Self-pay

## 2014-03-27 NOTE — Telephone Encounter (Signed)
Opened orders due to lab needed... Labs are not needed.

## 2014-05-09 ENCOUNTER — Other Ambulatory Visit: Payer: Self-pay

## 2014-06-02 ENCOUNTER — Other Ambulatory Visit: Payer: Self-pay

## 2014-06-02 MED ORDER — ROSUVASTATIN CALCIUM 40 MG PO TABS: ORAL_TABLET | ORAL | Status: DC

## 2014-08-26 ENCOUNTER — Other Ambulatory Visit: Payer: Self-pay | Admitting: Physician Assistant

## 2014-11-28 ENCOUNTER — Encounter: Payer: Self-pay | Admitting: Gastroenterology

## 2015-01-19 ENCOUNTER — Other Ambulatory Visit: Payer: Self-pay

## 2015-04-07 DIAGNOSIS — E785 Hyperlipidemia, unspecified: Secondary | ICD-10-CM

## 2015-04-07 HISTORY — DX: Hyperlipidemia, unspecified: E78.5

## 2016-08-12 LAB — PROTIME-INR: Protime: 12.1 s (ref 10.0–13.8)

## 2016-08-12 LAB — HEMOGLOBIN A1C: Hemoglobin A1C: 5

## 2016-08-12 LAB — CBC AND DIFFERENTIAL
HCT: 39 % — AB (ref 41–53)
Hemoglobin: 13.4 g/dL — AB (ref 13.5–17.5)
Platelets: 121 K/µL — AB (ref 150–399)
WBC: 6.6 10*3/mL

## 2016-08-12 LAB — POCT INR: INR: 1.2 — AB (ref 0.9–1.1)

## 2016-08-13 ENCOUNTER — Encounter (HOSPITAL_COMMUNITY): Payer: Self-pay | Admitting: *Deleted

## 2016-08-13 ENCOUNTER — Inpatient Hospital Stay (HOSPITAL_COMMUNITY)
Admission: AD | Admit: 2016-08-13 | Discharge: 2016-08-16 | DRG: 640 | Disposition: A | Discharge status: Skilled Nursing Facility | Payer: Medicare Other | Source: Other Acute Inpatient Hospital | Attending: Family Medicine | Admitting: Family Medicine

## 2016-08-13 DIAGNOSIS — Z79899 Other long term (current) drug therapy: Secondary | ICD-10-CM

## 2016-08-13 DIAGNOSIS — G9340 Encephalopathy, unspecified: Secondary | ICD-10-CM | POA: Diagnosis present

## 2016-08-13 DIAGNOSIS — Z515 Encounter for palliative care: Secondary | ICD-10-CM | POA: Diagnosis present

## 2016-08-13 DIAGNOSIS — Z8249 Family history of ischemic heart disease and other diseases of the circulatory system: Secondary | ICD-10-CM | POA: Diagnosis not present

## 2016-08-13 DIAGNOSIS — E785 Hyperlipidemia, unspecified: Secondary | ICD-10-CM | POA: Diagnosis present

## 2016-08-13 DIAGNOSIS — E876 Hypokalemia: Secondary | ICD-10-CM | POA: Diagnosis present

## 2016-08-13 DIAGNOSIS — Z833 Family history of diabetes mellitus: Secondary | ICD-10-CM | POA: Diagnosis not present

## 2016-08-13 DIAGNOSIS — F329 Major depressive disorder, single episode, unspecified: Secondary | ICD-10-CM | POA: Diagnosis present

## 2016-08-13 DIAGNOSIS — Z9861 Coronary angioplasty status: Secondary | ICD-10-CM | POA: Diagnosis not present

## 2016-08-13 DIAGNOSIS — N189 Chronic kidney disease, unspecified: Secondary | ICD-10-CM | POA: Diagnosis present

## 2016-08-13 DIAGNOSIS — I25810 Atherosclerosis of coronary artery bypass graft(s) without angina pectoris: Secondary | ICD-10-CM | POA: Diagnosis not present

## 2016-08-13 DIAGNOSIS — E860 Dehydration: Secondary | ICD-10-CM | POA: Diagnosis not present

## 2016-08-13 DIAGNOSIS — R590 Localized enlarged lymph nodes: Secondary | ICD-10-CM | POA: Diagnosis present

## 2016-08-13 DIAGNOSIS — I10 Essential (primary) hypertension: Secondary | ICD-10-CM | POA: Diagnosis present

## 2016-08-13 DIAGNOSIS — G9341 Metabolic encephalopathy: Secondary | ICD-10-CM | POA: Diagnosis present

## 2016-08-13 DIAGNOSIS — Z7982 Long term (current) use of aspirin: Secondary | ICD-10-CM

## 2016-08-13 DIAGNOSIS — Z888 Allergy status to other drugs, medicaments and biological substances status: Secondary | ICD-10-CM | POA: Diagnosis not present

## 2016-08-13 DIAGNOSIS — K219 Gastro-esophageal reflux disease without esophagitis: Secondary | ICD-10-CM | POA: Diagnosis present

## 2016-08-13 DIAGNOSIS — I639 Cerebral infarction, unspecified: Secondary | ICD-10-CM | POA: Diagnosis present

## 2016-08-13 DIAGNOSIS — I129 Hypertensive chronic kidney disease with stage 1 through stage 4 chronic kidney disease, or unspecified chronic kidney disease: Secondary | ICD-10-CM | POA: Diagnosis present

## 2016-08-13 DIAGNOSIS — Z951 Presence of aortocoronary bypass graft: Secondary | ICD-10-CM

## 2016-08-13 DIAGNOSIS — I2510 Atherosclerotic heart disease of native coronary artery without angina pectoris: Secondary | ICD-10-CM | POA: Diagnosis present

## 2016-08-13 DIAGNOSIS — Z823 Family history of stroke: Secondary | ICD-10-CM

## 2016-08-13 DIAGNOSIS — Z66 Do not resuscitate: Secondary | ICD-10-CM | POA: Diagnosis present

## 2016-08-13 DIAGNOSIS — E782 Mixed hyperlipidemia: Secondary | ICD-10-CM | POA: Diagnosis present

## 2016-08-13 DIAGNOSIS — R7309 Other abnormal glucose: Secondary | ICD-10-CM | POA: Diagnosis present

## 2016-08-13 DIAGNOSIS — Z7902 Long term (current) use of antithrombotics/antiplatelets: Secondary | ICD-10-CM | POA: Diagnosis not present

## 2016-08-13 DIAGNOSIS — R739 Hyperglycemia, unspecified: Secondary | ICD-10-CM | POA: Diagnosis present

## 2016-08-13 DIAGNOSIS — N184 Chronic kidney disease, stage 4 (severe): Secondary | ICD-10-CM | POA: Diagnosis not present

## 2016-08-13 LAB — CBC WITH DIFFERENTIAL/PLATELET
Basophils Absolute: 0 10*3/uL (ref 0.0–0.1)
Basophils Relative: 0 %
EOS ABS: 0.2 10*3/uL (ref 0.0–0.7)
EOS PCT: 4 %
HCT: 34.4 % — ABNORMAL LOW (ref 39.0–52.0)
Hemoglobin: 12.3 g/dL — ABNORMAL LOW (ref 13.0–17.0)
LYMPHS ABS: 0.9 10*3/uL (ref 0.7–4.0)
Lymphocytes Relative: 18 %
MCH: 31.7 pg (ref 26.0–34.0)
MCHC: 35.8 g/dL (ref 30.0–36.0)
MCV: 88.7 fL (ref 78.0–100.0)
MONO ABS: 0.9 10*3/uL (ref 0.1–1.0)
Monocytes Relative: 17 %
Neutro Abs: 3.2 10*3/uL (ref 1.7–7.7)
Neutrophils Relative %: 62 %
PLATELETS: 99 10*3/uL — AB (ref 150–400)
RBC: 3.88 MIL/uL — AB (ref 4.22–5.81)
RDW: 17.4 % — AB (ref 11.5–15.5)
WBC: 5.1 10*3/uL (ref 4.0–10.5)

## 2016-08-13 LAB — COMPREHENSIVE METABOLIC PANEL
ALK PHOS: 198 U/L — AB (ref 38–126)
ALT: 61 U/L (ref 17–63)
AST: 89 U/L — ABNORMAL HIGH (ref 15–41)
Albumin: 2.8 g/dL — ABNORMAL LOW (ref 3.5–5.0)
Anion gap: 7 (ref 5–15)
BUN: 29 mg/dL — AB (ref 6–20)
CALCIUM: 10.1 mg/dL (ref 8.9–10.3)
CO2: 23 mmol/L (ref 22–32)
CREATININE: 1.97 mg/dL — AB (ref 0.61–1.24)
Chloride: 110 mmol/L (ref 101–111)
GFR calc Af Amer: 37 mL/min — ABNORMAL LOW (ref >60)
GFR, EST NON AFRICAN AMERICAN: 32 mL/min — AB (ref >60)
Glucose, Bld: 133 mg/dL — ABNORMAL HIGH (ref 65–99)
Potassium: 2.8 mmol/L — ABNORMAL LOW (ref 3.5–5.1)
SODIUM: 140 mmol/L (ref 135–145)
Total Bilirubin: 2 mg/dL — ABNORMAL HIGH (ref 0.3–1.2)
Total Protein: 5.8 g/dL — ABNORMAL LOW (ref 6.5–8.1)

## 2016-08-13 LAB — BASIC METABOLIC PANEL
BUN: 33 mg/dL — AB (ref 4–21)
BUN: 29 mg/dL — AB (ref 4–21)
Creatinine: 2 mg/dL — AB (ref 0.6–1.3)
Creatinine: 2 mg/dL — AB (ref 0.6–1.3)
GLUCOSE: 133 mg/dL
GLUCOSE: 113 mg/dL
POTASSIUM: 2.8 mmol/L — AB (ref 3.4–5.3)
Potassium: 2.9 mmol/L — AB (ref 3.4–5.3)
SODIUM: 141 mmol/L (ref 137–147)
Sodium: 140 mmol/L (ref 137–147)

## 2016-08-13 LAB — URINALYSIS, COMPLETE (UACMP) WITH MICROSCOPIC
Bilirubin Urine: NEGATIVE
Glucose, UA: 50 mg/dL — AB
Ketones, ur: NEGATIVE mg/dL
LEUKOCYTES UA: NEGATIVE
Nitrite: NEGATIVE
PH: 5 (ref 5.0–8.0)
Protein, ur: 30 mg/dL — AB
SPECIFIC GRAVITY, URINE: 1.017 (ref 1.005–1.030)

## 2016-08-13 LAB — CBC AND DIFFERENTIAL
HCT: 35 % — AB (ref 41–53)
HCT: 34 % — AB (ref 41–53)
HEMOGLOBIN: 12.3 g/dL — AB (ref 13.5–17.5)
HEMOGLOBIN: 12.1 g/dL — AB (ref 13.5–17.5)
PLATELETS: 104 10*3/uL — AB (ref 150–399)
Platelets: 99 10*3/uL — AB (ref 150–399)
WBC: 5 10*3/mL
WBC: 5.1 10^3/mL

## 2016-08-13 LAB — HEPATIC FUNCTION PANEL
ALT: 55 U/L — AB (ref 10–40)
ALT: 61 U/L — AB (ref 10–40)
AST: 81 U/L — AB (ref 14–40)
AST: 89 U/L — AB (ref 14–40)
Alkaline Phosphatase: 195 U/L — AB (ref 25–125)
Alkaline Phosphatase: 198 U/L — AB (ref 25–125)
BILIRUBIN, TOTAL: 2 mg/dL
BILIRUBIN, TOTAL: 2.2 mg/dL

## 2016-08-13 LAB — MAGNESIUM: Magnesium: 1.9 mg/dL (ref 1.7–2.4)

## 2016-08-13 LAB — TSH
TSH: 1.44 u[IU]/mL (ref 0.41–5.90)
TSH: 1.648 u[IU]/mL (ref 0.350–4.500)
TSH: 1.65 u[IU]/mL (ref 0.41–5.90)

## 2016-08-13 MED ORDER — FOLIC ACID 1 MG PO TABS
1.0000 mg | ORAL_TABLET | Freq: Every day | ORAL | Status: DC
  Administered 2016-08-13 – 2016-08-15 (×3): 1 mg via ORAL
  Filled 2016-08-13 (×3): qty 1

## 2016-08-13 MED ORDER — ACETAMINOPHEN 325 MG PO TABS: 650.0000 mg | ORAL_TABLET | Freq: Four times a day (QID) | ORAL | Status: DC | PRN

## 2016-08-13 MED ORDER — HEPARIN SODIUM (PORCINE) 5000 UNIT/ML IJ SOLN
5000.0000 [IU] | Freq: Three times a day (TID) | INTRAMUSCULAR | Status: DC
  Administered 2016-08-13 – 2016-08-16 (×9): 5000 [IU] via SUBCUTANEOUS
  Filled 2016-08-13 (×9): qty 1

## 2016-08-13 MED ORDER — CARVEDILOL 25 MG PO TABS
25.0000 mg | ORAL_TABLET | Freq: Two times a day (BID) | ORAL | Status: DC
  Administered 2016-08-13 – 2016-08-16 (×6): 25 mg via ORAL
  Filled 2016-08-13 (×7): qty 1

## 2016-08-13 MED ORDER — ONDANSETRON HCL 4 MG/2ML IJ SOLN
4.0000 mg | Freq: Four times a day (QID) | INTRAMUSCULAR | Status: DC | PRN
  Administered 2016-08-16: 4 mg via INTRAVENOUS
  Filled 2016-08-13: qty 2

## 2016-08-13 MED ORDER — ASPIRIN 81 MG PO CHEW
81.0000 mg | CHEWABLE_TABLET | Freq: Every day | ORAL | Status: DC
  Administered 2016-08-14 – 2016-08-15 (×2): 81 mg via ORAL
  Filled 2016-08-13 (×2): qty 1

## 2016-08-13 MED ORDER — TAMSULOSIN HCL 0.4 MG PO CAPS
0.4000 mg | ORAL_CAPSULE | Freq: Every day | ORAL | Status: DC
  Administered 2016-08-13 – 2016-08-16 (×4): 0.4 mg via ORAL
  Filled 2016-08-13 (×4): qty 1

## 2016-08-13 MED ORDER — VITAMIN D (ERGOCALCIFEROL) 1.25 MG (50000 UNIT) PO CAPS
50000.0000 [IU] | ORAL_CAPSULE | ORAL | Status: DC
  Administered 2016-08-14: 50000 [IU] via ORAL
  Filled 2016-08-13: qty 1

## 2016-08-13 MED ORDER — ACETAMINOPHEN 650 MG RE SUPP: 650.0000 mg | Freq: Four times a day (QID) | RECTAL | Status: DC | PRN

## 2016-08-13 MED ORDER — ALLOPURINOL 300 MG PO TABS
300.0000 mg | ORAL_TABLET | Freq: Every day | ORAL | Status: DC
  Administered 2016-08-13 – 2016-08-15 (×3): 300 mg via ORAL
  Filled 2016-08-13 (×3): qty 1

## 2016-08-13 MED ORDER — ISOSORBIDE DINITRATE 20 MG PO TABS
20.0000 mg | ORAL_TABLET | Freq: Two times a day (BID) | ORAL | Status: DC
  Administered 2016-08-13 – 2016-08-16 (×6): 20 mg via ORAL
  Filled 2016-08-13 (×7): qty 1

## 2016-08-13 MED ORDER — ROSUVASTATIN CALCIUM 20 MG PO TABS
40.0000 mg | ORAL_TABLET | Freq: Every day | ORAL | Status: DC
  Administered 2016-08-13 – 2016-08-14 (×2): 40 mg via ORAL
  Filled 2016-08-13 (×2): qty 2

## 2016-08-13 MED ORDER — ADULT MULTIVITAMIN W/MINERALS CH
1.0000 | ORAL_TABLET | Freq: Every day | ORAL | Status: DC
  Administered 2016-08-13 – 2016-08-15 (×3): 1 via ORAL
  Filled 2016-08-13 (×3): qty 1

## 2016-08-13 MED ORDER — CLOPIDOGREL BISULFATE 75 MG PO TABS
75.0000 mg | ORAL_TABLET | Freq: Every day | ORAL | Status: DC
  Administered 2016-08-14 – 2016-08-16 (×3): 75 mg via ORAL
  Filled 2016-08-13 (×4): qty 1

## 2016-08-13 MED ORDER — PAROXETINE HCL 30 MG PO TABS
30.0000 mg | ORAL_TABLET | Freq: Two times a day (BID) | ORAL | Status: DC
  Administered 2016-08-13 – 2016-08-16 (×6): 30 mg via ORAL
  Filled 2016-08-13 (×7): qty 1

## 2016-08-13 MED ORDER — VITAMIN B-1 100 MG PO TABS
100.0000 mg | ORAL_TABLET | Freq: Every day | ORAL | Status: DC
  Administered 2016-08-13 – 2016-08-15 (×3): 100 mg via ORAL
  Filled 2016-08-13 (×3): qty 1

## 2016-08-13 MED ORDER — ONDANSETRON HCL 4 MG PO TABS: 4.0000 mg | ORAL_TABLET | Freq: Four times a day (QID) | ORAL | Status: DC | PRN

## 2016-08-13 MED ORDER — FAMOTIDINE 20 MG PO TABS
10.0000 mg | ORAL_TABLET | Freq: Every day | ORAL | Status: DC
  Administered 2016-08-14 – 2016-08-15 (×2): 10 mg via ORAL
  Filled 2016-08-13 (×2): qty 1

## 2016-08-13 MED ORDER — SODIUM CHLORIDE 0.9 % IV SOLN
INTRAVENOUS | Status: DC
  Administered 2016-08-13: 75 mL/h via INTRAVENOUS

## 2016-08-13 NOTE — H&P (Addendum)
History and Physical    Bryan [name] [id number] DOB: 11-12-1940 DOA: 08/13/2016  PCP: Unice Cobble, MD   Patient coming from: [facility]  Chief Complaint: Altered mental status.   HPI: Bryan [name] is a 76 y.o. 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 He was admitted to the outside hospital last night with the working diagnosis of encephalopathy complicated by hypercalcemia and dehydration. Apparently for last 3 days patient has been confused and disoriented, this has been associated with generalized weakness, he had to use a walker for ambulation. Overall generalized malaise and decreased appetite. He was seen by his primary care physician who recommended hospitalization to rule out CVA. As a part of the workup he went CT of the abdomen and pelvis showing multiple retroperitoneal lymphadenopathy. Concern about malignancy was entertained, no oncology specialist at [facility], patient was transferred for further evaluation and possible oncology consultation.   Brain MRI was performed and report is pending.  Limited history due to patient's confusion, most information obtained from patient's wife at bedside and report from transferring physician.   ED Course: NA  Review of Systems:  Unable to obtain due to patient's confusion  Past Medical History:  Diagnosis Date  . Abdominal pain, recurrent    also epigastric...also IBS.....  . Abnormal blood chemistry   . Acute sprain or strain of cervical region   . CAD (coronary artery disease)   . Contusion of left foot   . Diverticulitis    PMH of  . Diverticulosis of colon    Dr Fuller Plan  . GERD (gastroesophageal reflux disease)   . Gout   . Hemorrhoids   . Hepatitis   . Hyperlipidemia   . Hypertension    unspecified  . Kidney stones    Dr Thomasene Mohair  . Low back pain   . Nephrolithiasis    hx of PMH of x4  . Renal insufficiency    chronic  .  Syncope   . Tubular adenoma of colon 07/2005    Past Surgical History:  Procedure Laterality Date  . ANKLE SURGERY    . APPENDECTOMY    . CARDIAC CATHETERIZATION  10/22/2012   Dr Atilano Median; 2  100 % blockages posterior aspect of heart  . CHOLECYSTECTOMY    . COLON SURGERY  2005   left hemicolectomy for diverticulitis  . colonoscopy with polypectomy     Dr Fuller Plan  . CORONARY ANGIOPLASTY  12/2011   catheter balloon ruptured  . CORONARY ANGIOPLASTY WITH STENT PLACEMENT     total 10 stents  . CORONARY ARTERY BYPASS GRAFT    . INCISIONAL HERNIA REPAIR  2012   Dr Raul Del; with mesh , Laparscopically  . PTCA     multiple times  . renal calculi     x2   . SMALL INTESTINE SURGERY  2006   3 feet resected at ventral hernia surgery     reports that he has never smoked. He has never used smokeless tobacco. He reports that he does not drink alcohol or use drugs.  Allergies  Allergen Reactions  . Atorvastatin     REACTION: MUSCLE ACHES  . Rosuvastatin     REACTION: muscle aches/headaches  . Simvastatin     REACTION: MUSCLE ACHES    Family History  Problem Relation Age of Onset  . Stroke Mother 75  . Hypertension Mother   . Heart disease Mother   . Breast cancer Mother   .  Diabetes Mother   . Heart attack Father 60  . Heart attack Sister 66  . Heart attack Brother 13  . Alcohol abuse Brother     1 brother  . Colon cancer Neg Hx   . Heart attack Brother 63  . Heart attack Brother      > 55   Unacceptable: Noncontributory, unremarkable, or negative. Acceptable: Family history reviewed and not pertinent (If you reviewed it)  Prior to Admission medications   Medication Sig Start Date End Date Taking? Authorizing Provider  allopurinol (ZYLOPRIM) 300 MG tablet Take 1 tablet by mouth daily. 08/16/13   Historical Provider, MD  amLODipine (NORVASC) 10 MG tablet Take 1 tablet by mouth daily. 08/08/13   Historical Provider, MD  Ascorbic Acid (VITAMIN C) 1000 MG tablet Take 1,000 mg by  mouth daily.      Historical Provider, MD  aspirin 81 MG tablet Take 81 mg by mouth daily.      Historical Provider, MD  carvedilol (COREG) 25 MG tablet Takes 1-3 by mouth once daily    Historical Provider, MD  Choline Fenofibrate (TRILIPIX) 135 MG capsule Take 1 capsule (135 mg total) by mouth daily. 11/20/13   Hendricks Limes, MD  clopidogrel (PLAVIX) 75 MG tablet Take 75 mg by mouth daily.    Historical Provider, MD  FIBER PO Take by mouth. 2 tabs daily     Historical Provider, MD  gabapentin (NEURONTIN) 100 MG capsule Take 1 capsule (100 mg total) by mouth as directed. 1 pill every 8 hours as needed for nerve pain 09/13/11 07/25/97  Hendricks Limes, MD  glucose blood (ONE TOUCH TEST STRIPS) test strip 1 each by Other route as needed. Onetouch ultra test strip.Marland KitchenMarland KitchenProvider, please review the patient's drug allergy history. Some issues need clarification. Blood sugar once daily      Historical Provider, MD  glycopyrrolate (ROBINUL) 2 MG tablet Take 1 tablet (2 mg total) by mouth 2 (two) times daily. 12/26/11 07/25/97  Ladene Artist, MD  isosorbide dinitrate (ISORDIL) 20 MG tablet Take 20 mg by mouth 2 (two) times daily.    Historical Provider, MD  losartan (COZAAR) 50 MG tablet TAKE 2 TABLETS EVERY DAY 05/20/13   Hendricks Limes, MD  nitroGLYCERIN (NITROSTAT) 0.4 MG SL tablet Place 0.4 mg under the tongue as needed.      Historical Provider, MD  PARoxetine (PAXIL) 30 MG tablet TAKE 1 TABLET BY MOUTH TWICE DAILY 01/27/14   Hendricks Limes, MD  ranitidine (ZANTAC) 150 MG tablet Take 1 tablet (150 mg total) by mouth 2 (two) times daily. 03/04/13   Hendricks Limes, MD  rosuvastatin (CRESTOR) 40 MG tablet TAKE 1 TABLET BY MOUTH EVERY DAY 06/02/14   Hendricks Limes, MD  vitamin E (VITAMIN E) 400 UNIT capsule Take 400 Units by mouth daily.      Historical Provider, MD    Physical Exam: Vitals:   08/13/16 1631  BP: 130/69  Pulse: 66  Resp: 16  Temp: 98.5 F (36.9 C)  TempSrc: Oral  SpO2: 98%       Constitutional: deconditioned and ill looking appering Vitals:   08/13/16 1631  BP: 130/69  Pulse: 66  Resp: 16  Temp: 98.5 F (36.9 C)  TempSrc: Oral  SpO2: 98%   Eyes: PERRL, lids and conjunctivae mild pale.  Head normocephalic, nose and ears with no deformities.  ENMT: Mucous membranes are dry. Posterior pharynx clear of any exudate or lesions.Normal dentition.  Neck: normal,  supple, no masses, no thyromegaly Respiratory: clear to auscultation bilaterally, no wheezing, no crackles. Normal respiratory effort. No accessory muscle use.  Cardiovascular: Regular rate and rhythm, no murmurs / rubs / gallops. No extremity edema. 2+ pedal pulses. No carotid bruits.  Abdomen: no tenderness, no masses palpated. No hepatosplenomegaly. Bowel sounds positive.  Musculoskeletal: no clubbing / cyanosis. No joint deformity upper and lower extremities. Good ROM, no contractures. Normal muscle tone.  Skin: no rashes, lesions, ulcers. No induration Neurologic: CN 2-12 grossly intact. Sensation intact, DTR normal. Strength 5/5 in all 4. Speech is slurred.   Labs on Admission: I have personally reviewed following labs and imaging studies  CBC: No results for input(s): WBC, NEUTROABS, HGB, HCT, MCV, PLT in the last 168 hours. Basic Metabolic Panel: No results for input(s): NA, K, CL, CO2, GLUCOSE, BUN, CREATININE, CALCIUM, MG, PHOS in the last 168 hours. GFR: CrCl cannot be calculated (Patient's most recent lab result is older than the maximum 21 days allowed.). Liver Function Tests: No results for input(s): AST, ALT, ALKPHOS, BILITOT, PROT, ALBUMIN in the last 168 hours. No results for input(s): LIPASE, AMYLASE in the last 168 hours. No results for input(s): AMMONIA in the last 168 hours. Coagulation Profile: No results for input(s): INR, PROTIME in the last 168 hours. Cardiac Enzymes: No results for input(s): CKTOTAL, CKMB, CKMBINDEX, TROPONINI in the last 168 hours. BNP (last 3  results) No results for input(s): PROBNP in the last 8760 hours. HbA1C: No results for input(s): HGBA1C in the last 72 hours. CBG: No results for input(s): GLUCAP in the last 168 hours. Lipid Profile: No results for input(s): CHOL, HDL, LDLCALC, TRIG, CHOLHDL, LDLDIRECT in the last 72 hours. Thyroid Function Tests: No results for input(s): TSH, T4TOTAL, FREET4, T3FREE, THYROIDAB in the last 72 hours. Anemia Panel: No results for input(s): VITAMINB12, FOLATE, FERRITIN, TIBC, IRON, RETICCTPCT in the last 72 hours. Urine analysis:    Component Value Date/Time   COLORURINE yellow 10/30/2008 1444   APPEARANCEUR Clear 10/30/2008 1444   LABSPEC 1.015 10/30/2008 1444   PHURINE 6.0 10/30/2008 1444   GLUCOSEU NEGATIVE 05/30/2007 1453   HGBUR negative 10/30/2008 1444   BILIRUBINUR negative 10/30/2008 1444   KETONESUR NEGATIVE 05/30/2007 1453   PROTEINUR NEGATIVE 05/30/2007 1453   UROBILINOGEN 0.2 10/30/2008 1444   NITRITE negative 10/30/2008 1444   LEUKOCYTESUR  05/30/2007 1453    NEGATIVE MICROSCOPIC NOT DONE ON URINES WITH NEGATIVE PROTEIN, BLOOD, LEUKOCYTES, NITRITE, OR GLUCOSE <1000 mg/dL.   Sepsis Labs: !!!!!!!!!!!!!!!!!!!!!!!!!!!!!!!!!!!!!!!!!!!! @LABRCNTIP (procalcitonin:4,lacticidven:4) )No results found for this or any previous visit (from the past 240 hour(s)).   Radiological Exams on Admission: No results found.  EKG: Independently reviewed. NA  Assessment/Plan Active Problems:   * No active hospital problems. *   This is a 76 year old male who presents to the hospital as a transfer from [facility] due to encephalopathy. Apparently he was found dehydrated and hypercalcemic, the workup showed retroperitoneal lymphadenopathy, questionable malignancy as a cause of patient's symptoms. Physical examination his blood pressure is 130/69, temperature 98.5, heart 66, respiratory rate 16, oxygen saturation 98%, he has mild dry oral mucosa, his lungs are clear auscultation,  heart S1-S2 present rhythmic, abdomen soft nontender, extremities with no edema, neurologically patient is oriented 1 but otherwise nonfocal.  Working diagnosis. Metabolic encephalopathy due to possibly hypercalcemia and dehydration complicated by retroperitoneal lymphadenopathy.  1. Dehydration and hypercalcemia. Resume hydration with isotonic saline, will check chemistry today and follow-up in the morning. Will hold on diuretics and ACE inhibitor  2. Metabolic encephalopathy. Continue supportive care with IV fluids, correction of electrolytes. Follow-up on MRI report from outside hospital. No checks every 4 hours, physical therapy evaluation. Patient does have CVAs the past, patient is high-risk for developing delirium. Check urinalysis  3. Retroperitoneal lymphadenopathy. Follow-up with formal report from radiology, will correct electrolytes and encephalopathy, then will assess need for oncology consultation.  4. Coronary disease status post CABG. Continue aspirin, Plavix, isosorbide Crestor.  5. Hypertension. Continue Coreg, amlodipine. Will hold losartan for now due to risk of kidney injury.  6. Depression. Continue paroxetine.   Late entry: MRI  FINDINGS: #Skull/marrow/soft tissues: Unremarkable. #Orbits: Prior bilateral cataract surgery. #Sinuses and mastoid air cells: Minimal maxillary, and ethmoid mucosal thickening. #Brain: There is a 7 mm area of minimally restricted diffusion with surrounding T2 edema in the posterior periventricular white matter on the right. This is nonspecific and could represent subacute infarct or mass lesion. No focal hemorrhage. There are  old, small infarctions with encephalomalacia in the right frontal lobe at the anterior part of middle frontal gyrus, and in the inferior left cerebellar hemisphere. Mild age-appropriate atrophy. Commensurate ventricular size.Marland Kitchen #Vessels and Dural Venous Sinuses:Appear patent with normal flow voids.  Will call a  non urgent Neurology consult in am. Doubt this finding explains patient's current symptoms. Currently patient is non focal, will continue neuro checks.   DVT prophylaxis: enoxaparin Code Status: Full  Family Communication: I spoke with patient's wife at the bedside and all questions were addressed.  Disposition Plan: Home  Consults called: None  Admission status: Inpatient  Neala Miggins Gerome Apley MD [facility] Pager [phone]  If 7PM-7AM, please contact night-coverage [email or web address] Password [facility]  08/13/2016, 4:57 PM

## 2016-08-14 ENCOUNTER — Encounter (HOSPITAL_COMMUNITY): Payer: Self-pay | Admitting: Family Medicine

## 2016-08-14 DIAGNOSIS — G9340 Encephalopathy, unspecified: Secondary | ICD-10-CM

## 2016-08-14 DIAGNOSIS — E876 Hypokalemia: Secondary | ICD-10-CM | POA: Diagnosis present

## 2016-08-14 DIAGNOSIS — I10 Essential (primary) hypertension: Secondary | ICD-10-CM

## 2016-08-14 DIAGNOSIS — N184 Chronic kidney disease, stage 4 (severe): Secondary | ICD-10-CM

## 2016-08-14 DIAGNOSIS — I639 Cerebral infarction, unspecified: Secondary | ICD-10-CM

## 2016-08-14 LAB — CBC
HCT: 33.4 % — ABNORMAL LOW (ref 39.0–52.0)
Hemoglobin: 11.6 g/dL — ABNORMAL LOW (ref 13.0–17.0)
MCH: 30.4 pg (ref 26.0–34.0)
MCHC: 34.7 g/dL (ref 30.0–36.0)
MCV: 87.7 fL (ref 78.0–100.0)
Platelets: 103 K/uL — ABNORMAL LOW (ref 150–400)
RBC: 3.81 MIL/uL — ABNORMAL LOW (ref 4.22–5.81)
RDW: 17.2 % — ABNORMAL HIGH (ref 11.5–15.5)
WBC: 4.8 K/uL (ref 4.0–10.5)

## 2016-08-14 LAB — COMPREHENSIVE METABOLIC PANEL WITH GFR
ALT: 58 U/L (ref 17–63)
AST: 87 U/L — ABNORMAL HIGH (ref 15–41)
Albumin: 2.8 g/dL — ABNORMAL LOW (ref 3.5–5.0)
Alkaline Phosphatase: 177 U/L — ABNORMAL HIGH (ref 38–126)
Anion gap: 5 (ref 5–15)
BUN: 27 mg/dL — ABNORMAL HIGH (ref 6–20)
CO2: 20 mmol/L — ABNORMAL LOW (ref 22–32)
Calcium: 10.3 mg/dL (ref 8.9–10.3)
Chloride: 115 mmol/L — ABNORMAL HIGH (ref 101–111)
Creatinine, Ser: 1.72 mg/dL — ABNORMAL HIGH (ref 0.61–1.24)
GFR calc Af Amer: 43 mL/min — ABNORMAL LOW
GFR calc non Af Amer: 37 mL/min — ABNORMAL LOW
Glucose, Bld: 117 mg/dL — ABNORMAL HIGH (ref 65–99)
Potassium: 2.8 mmol/L — ABNORMAL LOW (ref 3.5–5.1)
Sodium: 140 mmol/L (ref 135–145)
Total Bilirubin: 2.3 mg/dL — ABNORMAL HIGH (ref 0.3–1.2)
Total Protein: 5.8 g/dL — ABNORMAL LOW (ref 6.5–8.1)

## 2016-08-14 LAB — HEPATIC FUNCTION PANEL: Bilirubin, Total: 2.3 mg/dL

## 2016-08-14 LAB — BASIC METABOLIC PANEL
BUN: 27 mg/dL — AB (ref 4–21)
CREATININE: 1.7 mg/dL — AB (ref 0.6–1.3)
Glucose: 117 mg/dL
Potassium: 2.8 mmol/L — AB (ref 3.4–5.3)
Sodium: 140 mmol/L (ref 137–147)

## 2016-08-14 LAB — CBC AND DIFFERENTIAL: WBC: 4.8 10*3/mL

## 2016-08-14 MED ORDER — LORAZEPAM 2 MG/ML IJ SOLN
0.5000 mg | INTRAMUSCULAR | Status: AC | PRN
  Administered 2016-08-14 (×2): 0.5 mg via INTRAVENOUS
  Filled 2016-08-14: qty 1

## 2016-08-14 MED ORDER — QUETIAPINE FUMARATE 25 MG PO TABS
25.0000 mg | ORAL_TABLET | Freq: Every evening | ORAL | Status: DC | PRN
  Administered 2016-08-15: 25 mg via ORAL
  Filled 2016-08-14: qty 1

## 2016-08-14 MED ORDER — POTASSIUM CHLORIDE IN NACL 40-0.9 MEQ/L-% IV SOLN
INTRAVENOUS | Status: AC
Start: 2016-08-14 — End: 2016-08-15
  Administered 2016-08-14 – 2016-08-15 (×2): 60 mL/h via INTRAVENOUS
  Filled 2016-08-14 (×2): qty 1000

## 2016-08-14 MED ORDER — HALOPERIDOL LACTATE 5 MG/ML IJ SOLN: 2.0000 mg | Freq: Four times a day (QID) | INTRAMUSCULAR | Status: DC | PRN

## 2016-08-14 MED ORDER — LORAZEPAM 2 MG/ML IJ SOLN: 0.5000 mg | INTRAMUSCULAR | Status: DC | PRN

## 2016-08-14 MED ORDER — POTASSIUM CHLORIDE CRYS ER 20 MEQ PO TBCR
40.0000 meq | EXTENDED_RELEASE_TABLET | Freq: Once | ORAL | Status: AC
  Administered 2016-08-14: 40 meq via ORAL
  Filled 2016-08-14: qty 2

## 2016-08-14 NOTE — Progress Notes (Signed)
Pt restless, trying to get out of bed alone. Pulled out one of his IV's. Restarted it and applied bilateral mittens. Pt taking off mittens.  Stating that he wanted "to see his puppies across the room and he had to get his clothes on to go home". Was able to reorient him for a few minutes, but after a few minutes he was disoriented again.  Notified MD on call. Obtained new orders.

## 2016-08-14 NOTE — Progress Notes (Signed)
PROGRESS NOTE    Bryan [name]  [id number]  DOB: 1941/06/28  DOA: 08/13/2016 PCP: Unice Cobble, MD  Hospital course: MILFRED ZEINER is a 76 y.o. 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 He was admitted to the outside hospital last night with the working diagnosis of encephalopathy complicated by hypercalcemia and dehydration. Apparently for last 3 days patient has been confused and disoriented, this has been associated with generalized weakness, he had to use a walker for ambulation. Overall generalized malaise and decreased appetite. He was seen by his primary care physician who recommended hospitalization to rule out CVA. As a part of the workup he went CT of the abdomen and pelvis showing multiple retroperitoneal lymphadenopathy. Concern about malignancy was entertained, no oncology specialist at [facility], patient was transferred for further evaluation and possible oncology consultation.   Assessment & Plan:  1. Dehydration and hypercalcemia. Continue IVF, hypercalcemia has resolved, will check chemistry today and follow-up in the morning. Will hold on diuretics and ACE inhibitor  2. Acute encephalopathy. Likely secondary to acute CVA as seen on MRI from [facility].  I had long discussion with family and they have decided to pursue comfort and dignity care.  Continue supportive care with IV fluids, correction of electrolytes.  Get physical therapy evaluation. Patient does have CVAs the past, patient is high-risk for developing delirium.  Pt is DNR/DNI per wife and daughter.   3. Retroperitoneal lymphadenopathy. I discussed with family and they decided not to pursue this further.  They want to focus on comfort and dignity care and not get an oncology consult and not interested in pursuing biopsy, radiation or chemotherapy.  4. Coronary disease status post CABG. Continue aspirin, Plavix, isosorbide  Crestor.  5. Hypertension. Continue Coreg, amlodipine.   6. Depression. Continue paroxetine as he becomes severely agitated without it.    Subjective: Pt very confused but has been eating and drinking well today.   Objective: Vitals:   08/13/16 1631 08/13/16 2125 08/14/16 0548 08/14/16 1404  BP: 130/69 (!) 122/47 (!) 135/57 127/66  Pulse: 66 65 65 63  Resp: 16 16 18 17   Temp: 98.5 F (36.9 C) 98.3 F (36.8 C) 99.1 F (37.3 C) 98.4 F (36.9 C)  TempSrc: Oral Oral Oral Oral  SpO2: 98% 97% 98% 97%    Intake/Output Summary (Last 24 hours) at 08/14/16 1518 Last data filed at 08/14/16 1333  Gross per 24 hour  Intake          1470.25 ml  Output             2000 ml  Net          -529.75 ml   There were no vitals filed for this visit.  Exam:  General exam: elderly male, chronically ill appearing, confused Respiratory system:  No increased work of breathing. Cardiovascular system: S1 & S2 heard Gastrointestinal system: Abdomen is nondistended, soft and nontender. Normal bowel sounds heard. Central nervous system: Awake, alert. Moving all extremities. Extremities: no cyanosis.  Data Reviewed: Basic Metabolic Panel:  Recent Labs Lab 08/13/16 1910 08/14/16 0753  NA 140 140  K 2.8* 2.8*  CL 110 115*  CO2 23 20*  GLUCOSE 133* 117*  BUN 29* 27*  CREATININE 1.97* 1.72*  CALCIUM 10.1 10.3  MG 1.9  --    Liver Function Tests:  Recent Labs Lab 08/13/16 1910 08/14/16 0753  AST 89* 87*  ALT 61 58  ALKPHOS 198*  177*  BILITOT 2.0* 2.3*  PROT 5.8* 5.8*  ALBUMIN 2.8* 2.8*   No results for input(s): LIPASE, AMYLASE in the last 168 hours. No results for input(s): AMMONIA in the last 168 hours. CBC:  Recent Labs Lab 08/13/16 1910 08/14/16 0753  WBC 5.1 4.8  NEUTROABS 3.2  --   HGB 12.3* 11.6*  HCT 34.4* 33.4*  MCV 88.7 87.7  PLT 99* 103*   Cardiac Enzymes: No results for input(s): CKTOTAL, CKMB, CKMBINDEX, TROPONINI in the last 168 hours. CBG (last 3)   No results for input(s): GLUCAP in the last 72 hours. No results found for this or any previous visit (from the past 240 hour(s)).   Studies: No results found.   Scheduled Meds: . allopurinol  300 mg Oral Daily  . aspirin  81 mg Oral Daily  . carvedilol  25 mg Oral BID WC  . clopidogrel  75 mg Oral Daily  . famotidine  10 mg Oral Daily  . folic acid  1 mg Oral Daily  . heparin  5,000 Units Subcutaneous Q8H  . isosorbide dinitrate  20 mg Oral BID  . multivitamin with minerals  1 tablet Oral Daily  . PARoxetine  30 mg Oral BID  . rosuvastatin  40 mg Oral Daily  . tamsulosin  0.4 mg Oral Daily  . thiamine  100 mg Oral Daily  . Vitamin D (Ergocalciferol)  50,000 Units Oral Weekly   Continuous Infusions: . 0.9 % NaCl with KCl 40 mEq / L 60 mL/hr (08/14/16 1134)   Active Problems:   HYPERLIPIDEMIA   Essential hypertension   Coronary atherosclerosis   Chronic kidney disease   HYPERGLYCEMIA   Encephalopathy acute   Hypokalemia  Time spent:   Irwin Brakeman, MD, FAAFP [facility] Pager [phone]  If 7PM-7AM, please contact night-coverage [email or web address] Password [email or web address] 08/14/2016, 3:18 PM    LOS: 1 day

## 2016-08-14 NOTE — Progress Notes (Signed)
Pt still not relaxing. Trying to still get out of bed and pull out his IV.  Administered 0.5 mg more of Ativan per order.  Will continue to monitor.

## 2016-08-14 NOTE — Progress Notes (Signed)
PT Cancellation Note  Patient Details Name: Bryan [name] MRN: [id number] DOB: Sep 03, 1940   Cancelled Treatment:    Reason Eval/Treat Not Completed: Fatigue/lethargy limiting ability to participate (family requests to allow patient to sleep. )   Claretha Cooper 08/14/2016, 4:16 PM

## 2016-08-14 NOTE — Progress Notes (Signed)
Pt finally starting to slow down and act like he is sleeping, but at the same time he seems to be fighting it. At lease he's not trying to crawl out of bed.

## 2016-08-15 DIAGNOSIS — E782 Mixed hyperlipidemia: Secondary | ICD-10-CM

## 2016-08-15 DIAGNOSIS — R7309 Other abnormal glucose: Secondary | ICD-10-CM

## 2016-08-15 LAB — RENAL FUNCTION PANEL
Albumin: 2.6 g/dL — ABNORMAL LOW (ref 3.5–5.0)
Anion gap: 4 — ABNORMAL LOW (ref 5–15)
BUN: 23 mg/dL — AB (ref 6–20)
CO2: 20 mmol/L — AB (ref 22–32)
CREATININE: 1.77 mg/dL — AB (ref 0.61–1.24)
Calcium: 10.6 mg/dL — ABNORMAL HIGH (ref 8.9–10.3)
Chloride: 115 mmol/L — ABNORMAL HIGH (ref 101–111)
GFR calc Af Amer: 42 mL/min — ABNORMAL LOW (ref >60)
GFR calc non Af Amer: 36 mL/min — ABNORMAL LOW (ref >60)
GLUCOSE: 108 mg/dL — AB (ref 65–99)
POTASSIUM: 3 mmol/L — AB (ref 3.5–5.1)
Phosphorus: 2.3 mg/dL — ABNORMAL LOW (ref 2.5–4.6)
Sodium: 139 mmol/L (ref 135–145)

## 2016-08-15 LAB — BASIC METABOLIC PANEL
BUN: 23 mg/dL — AB (ref 4–21)
Creatinine: 1.8 mg/dL — AB (ref 0.6–1.3)
GLUCOSE: 108 mg/dL
Sodium: 139 mmol/L (ref 137–147)

## 2016-08-15 MED ORDER — POTASSIUM CHLORIDE CRYS ER 20 MEQ PO TBCR
40.0000 meq | EXTENDED_RELEASE_TABLET | Freq: Once | ORAL | Status: AC
  Administered 2016-08-15: 40 meq via ORAL
  Filled 2016-08-15: qty 2

## 2016-08-15 MED ORDER — POTASSIUM CHLORIDE CRYS ER 20 MEQ PO TBCR
40.0000 meq | EXTENDED_RELEASE_TABLET | Freq: Once | ORAL | Status: DC
  Filled 2016-08-15: qty 2

## 2016-08-15 NOTE — Evaluation (Signed)
Clinical/Bedside Swallow Evaluation Patient Details  Name: Bryan [name] MRN: [id number] Date of Birth: 1941-07-08  Today's Date: 08/15/2016 Time: SLP Start Time (ACUTE ONLY): 1015 SLP Stop Time (ACUTE ONLY): 1039 SLP Time Calculation (min) (ACUTE ONLY): 24 min  Past Medical History:  Past Medical History:  Diagnosis Date  . Abdominal pain, recurrent    also epigastric...also IBS.....  . Abnormal blood chemistry   . Acute sprain or strain of cervical region   . CAD (coronary artery disease)   . Contusion of left foot   . Diverticulitis    PMH of  . Diverticulosis of colon    Dr Fuller Plan  . GERD (gastroesophageal reflux disease)   . Gout   . Hemorrhoids   . Hepatitis   . Hyperlipidemia   . Hypertension    unspecified  . Kidney stones    Dr Thomasene Mohair  . Low back pain   . Nephrolithiasis    hx of PMH of x4  . Renal insufficiency    chronic  . Syncope   . Tubular adenoma of colon 07/2005   Past Surgical History:  Past Surgical History:  Procedure Laterality Date  . ANKLE SURGERY    . APPENDECTOMY    . CARDIAC CATHETERIZATION  10/22/2012   Dr Atilano Median; 2  100 % blockages posterior aspect of heart  . CHOLECYSTECTOMY    . COLON SURGERY  2005   left hemicolectomy for diverticulitis  . colonoscopy with polypectomy     Dr Fuller Plan  . CORONARY ANGIOPLASTY  12/2011   catheter balloon ruptured  . CORONARY ANGIOPLASTY WITH STENT PLACEMENT     total 10 stents  . CORONARY ARTERY BYPASS GRAFT    . INCISIONAL HERNIA REPAIR  2012   Dr Raul Del; with mesh , Laparscopically  . PTCA     multiple times  . renal calculi     x2   . SMALL INTESTINE SURGERY  2006   3 feet resected at ventral hernia surgery   HPI:  76 year old male admitted 08/13/16 due to AMS, encephalopathy. PMH significant for CVAs, GERD, hepatitis.   Assessment / Plan / Recommendation Clinical Impression  Pt seated upright with RN assist. Pt required 1:1 assist with feeding. Intermittent cough noted, but not  immediately following po presentation. Pt primary issue at this time is lethargy and confusion. Will change diet to soft with chopped meats, thin liquids. 1:1 supervision and assist with feeding. ST will follow for assessment of diet tolerance, education, and readiness to advance. Precautions posted at [facility]. Wife reports no history of swallowing difficulty prior to admit.    Aspiration Risk  Moderate aspiration risk    Diet Recommendation Dysphagia 3 (Mech soft);Thin liquid   Liquid Administration via: Cup;Straw Medication Administration: Whole meds with puree Supervision: Full supervision/cueing for compensatory strategies;Staff to assist with self feeding Compensations: Minimize environmental distractions;Slow rate;Small sips/bites Postural Changes: Seated upright at 90 degrees;Remain upright for at least 30 minutes after po intake    Other  Recommendations Oral Care Recommendations: Oral care QID   Follow up Recommendations  (TBD)      Frequency and Duration min 1 x/week  2 weeks       Prognosis Prognosis for Safe Diet Advancement: Fair Barriers to Reach Goals: Cognitive deficits      Swallow Study   General Date of Onset: 08/13/16 HPI: 76 year old male admitted 08/13/16 due to AMS, encephalopathy. PMH significant for CVAs, GERD, hepatitis. Type of Study: Bedside Swallow Evaluation Previous Swallow  Assessment: none found Diet Prior to this Study: Regular;Thin liquids Temperature Spikes Noted: No Respiratory Status: Room air History of Recent Intubation: No Behavior/Cognition: Lethargic/Drowsy;Confused;Requires cueing Oral Cavity Assessment: Within Functional Limits Oral Care Completed by SLP: Yes Oral Cavity - Dentition: Adequate natural dentition Vision:  (kept eyes closed) Self-Feeding Abilities: Total assist Patient Positioning: Upright in bed Baseline Vocal Quality: Normal Volitional Cough: Weak Volitional Swallow: Able to elicit    Oral/Motor/Sensory Function  Overall Oral Motor/Sensory Function: Within functional limits   Ice Chips Ice chips: Within functional limits Presentation: Spoon   Thin Liquid Thin Liquid: Within functional limits Presentation: Straw    Nectar Thick Nectar Thick Liquid: Not tested   Honey Thick Honey Thick Liquid: Not tested   Puree Puree: Within functional limits Presentation: Spoon   Solid   GO   Solid: Impaired Oral Phase Impairments: Impaired mastication Oral Phase Functional Implications: Prolonged oral transit;Oral residue;Impaired mastication       Eward Rutigliano B. Quentin Ore [facility], CCC-SLP [id number]  Shonna Chock 08/15/2016,10:40 AM

## 2016-08-15 NOTE — Progress Notes (Signed)
Patient in bed resting,falling asleep off and on.  States that he does not have any complaints of pain, but states that he is very tired.

## 2016-08-15 NOTE — Evaluation (Signed)
Physical Therapy Evaluation Patient Details Name: Bryan [name] MRN: [id number] DOB: October 17, 1940 Today's Date: 08/15/2016   History of Present Illness  76 y.o. male admitted with confusion, weakness. Dx of encephalopathy, dehydration and new dx of multiple retroperitoneal lymphadenopathy (family wants no oncology consult, wants comfort care).   Clinical Impression  Pt admitted with above diagnosis. Pt currently with functional limitations due to the deficits listed below (see PT Problem List). Pt ambulated 2' with min/guard assist and RW. Pt is deconditioned, is a fall risk,  his wife isn't able to provide level of assistance he needs, therefore SNF recommended.  Pt will benefit from skilled PT to increase their independence and safety with mobility to allow discharge to the venue listed below.       Follow Up Recommendations SNF;Supervision for mobility/OOB    Equipment Recommendations  Rolling walker with 5" wheels    Recommendations for Other Services       Precautions / Restrictions Precautions Precautions: Fall Precaution Comments: 1 fall ~3 weeks ago Restrictions Weight Bearing Restrictions: No      Mobility  Bed Mobility Overal bed mobility: Needs Assistance Bed Mobility: Supine to Sit     Supine to sit: Mod assist     General bed mobility comments: mod A to raise trunk  Transfers Overall transfer level: Needs assistance Equipment used: Rolling walker (2 wheeled) Transfers: Sit to/from Stand Sit to Stand: From elevated surface;Min assist         General transfer comment: min A to rise  Ambulation/Gait Ambulation/Gait assistance: Min guard Ambulation Distance (Feet): 45 Feet Assistive device: Rolling walker (2 wheeled) Gait Pattern/deviations: Step-through pattern;Decreased step length - right;Decreased step length - left Gait velocity: decr Gait velocity interpretation: Below normal speed for age/gender General Gait Details: no LOB, increased time,  distance limited by fatigue  Stairs            Wheelchair Mobility    Modified Rankin (Stroke Patients Only)       Balance Overall balance assessment: History of Falls;Needs assistance   Sitting balance-Leahy Scale: Good       Standing balance-Leahy Scale: Poor                               Pertinent Vitals/Pain Pain Assessment: No/denies pain    Home Living Family/patient expects to be discharged to:: Skilled nursing facility                 Additional Comments: pt admitted from home where he was independent with mobility and ADLs until ~3 weeks ago    Prior Function Level of Independence: Needs assistance   Gait / Transfers Assistance Needed: at baseline is independent with mobility, recently he's used a walker and has needed assist with mobility due to being "wobbly"           Hand Dominance        Extremity/Trunk Assessment        Lower Extremity Assessment Lower Extremity Assessment: Generalized weakness (knee ext -4/5 B)    Cervical / Trunk Assessment Cervical / Trunk Assessment: Normal  Communication   Communication: No difficulties  Cognition Arousal/Alertness: Lethargic Behavior During Therapy: Flat affect Overall Cognitive Status: Impaired/Different from baseline Area of Impairment: Orientation;Following commands Orientation Level: Place;Time;Situation     Following Commands: Follows one step commands inconsistently            General Comments      Exercises  Assessment/Plan    PT Assessment Patient needs continued PT services  PT Problem List Decreased balance;Decreased strength;Decreased activity tolerance;Decreased mobility;Decreased safety awareness          PT Treatment Interventions DME instruction;Gait training;Functional mobility training;Balance training;Therapeutic exercise;Therapeutic activities;Patient/family education    PT Goals (Current goals can be found in the Care Plan section)   Acute Rehab PT Goals Patient Stated Goal: DC to SNF PT Goal Formulation: With family Time For Goal Achievement: 08/29/16 Potential to Achieve Goals: Fair    Frequency Min 3X/week   Barriers to discharge Decreased caregiver support wife unable to provide level of physical assistance pt needs    Co-evaluation               End of Session Equipment Utilized During Treatment: Gait belt Activity Tolerance: Patient tolerated treatment well Patient left: in chair;with call bell/phone within reach;with chair alarm set;with family/visitor present Nurse Communication: Mobility status         Time: 1414-1443 PT Time Calculation (min) (ACUTE ONLY): 29 min   Charges:   PT Evaluation $PT Eval Moderate Complexity: 1 Procedure PT Treatments $Gait Training: 8-22 mins   PT G Codes:        Philomena Doheny 08/15/2016, 3:00 PM [phone]

## 2016-08-15 NOTE — Progress Notes (Signed)
PROGRESS NOTE    Bryan [name]  [id number]  DOB: 06/29/1941  DOA: 08/13/2016 PCP: Unice Cobble, MD  Hospital course: Bryan [name] is a 76 y.o. 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 He was admitted to the outside hospital last night with the working diagnosis of encephalopathy complicated by hypercalcemia and dehydration. Apparently for last 3 days patient has been confused and disoriented, this has been associated with generalized weakness, he had to use a walker for ambulation. Overall generalized malaise and decreased appetite. He was seen by his primary care physician who recommended hospitalization to rule out CVA. As a part of the workup he went CT of the abdomen and pelvis showing multiple retroperitoneal lymphadenopathy. Concern about malignancy was entertained, no oncology specialist at [facility], patient was transferred for further evaluation and possible oncology consultation.   Assessment & Plan:  1. Dehydration and hypercalcemia. Continue gentle IVF and encourage oral drinking, hypercalcemia has resolved, Will hold on diuretics and ACE inhibitor  2. Acute encephalopathy. Likely secondary to acute CVA as seen on MRI from [facility].  I had long discussion with family and they have decided to pursue comfort and dignity care.  They asked to try to limit medications and lab draws.  Continue supportive care with IV fluids, correction of electrolytes.  Get physical therapy evaluation for help with placement. Patient does have CVAs the past, patient is high-risk for developing delirium.  Pt is DNR/DNI per wife and daughter.  Asked palliative team to help with comfort care orders and helping with determining appropriate disposition.   3. Retroperitoneal lymphadenopathy. I discussed with family and they decided not to pursue this further.  They want to focus on comfort and dignity care and not get an  oncology consult and not interested in pursuing biopsy, radiation or chemotherapy.  4. Coronary disease status post CABG.    5. Hypertension.  6. Depression. Continue paroxetine as he becomes severely agitated without it.    Subjective: Pt rested better after getting seroquel last night.   Objective: Vitals:   08/14/16 2112 08/15/16 0510 08/15/16 0800 08/15/16 1000  BP: 112/65 (!) 118/44  (!) 134/58  Pulse: 64 62 (!) 52 (!) 54  Resp: 16 16    Temp: 97.9 F (36.6 C) 98.2 F (36.8 C)    TempSrc: Oral Oral    SpO2: 98% 97%      Intake/Output Summary (Last 24 hours) at 08/15/16 1119 Last data filed at 08/15/16 0644  Gross per 24 hour  Intake             1586 ml  Output             1900 ml  Net             -314 ml   There were no vitals filed for this visit.  Exam:  General exam: elderly male, chronically ill appearing, confused Respiratory system:  No increased work of breathing. Cardiovascular system: S1 & S2 heard Gastrointestinal system: Abdomen is nondistended, soft and nontender. Normal bowel sounds heard. Central nervous system: Awake, alert. Moving all extremities. Extremities: no cyanosis.  Data Reviewed: Basic Metabolic Panel:  Recent Labs Lab 08/13/16 1910 08/14/16 0753 08/15/16 0424  NA 140 140 139  K 2.8* 2.8* 3.0*  CL 110 115* 115*  CO2 23 20* 20*  GLUCOSE 133* 117* 108*  BUN 29* 27* 23*  CREATININE 1.97* 1.72* 1.77*  CALCIUM 10.1 10.3 10.6*  MG 1.9  --   --   PHOS  --   --  2.3*   Liver Function Tests:  Recent Labs Lab 08/13/16 1910 08/14/16 0753 08/15/16 0424  AST 89* 87*  --   ALT 61 58  --   ALKPHOS 198* 177*  --   BILITOT 2.0* 2.3*  --   PROT 5.8* 5.8*  --   ALBUMIN 2.8* 2.8* 2.6*   No results for input(s): LIPASE, AMYLASE in the last 168 hours. No results for input(s): AMMONIA in the last 168 hours. CBC:  Recent Labs Lab 08/13/16 1910 08/14/16 0753  WBC 5.1 4.8  NEUTROABS 3.2  --   HGB 12.3* 11.6*  HCT 34.4* 33.4*   MCV 88.7 87.7  PLT 99* 103*   Cardiac Enzymes: No results for input(s): CKTOTAL, CKMB, CKMBINDEX, TROPONINI in the last 168 hours. CBG (last 3)  No results for input(s): GLUCAP in the last 72 hours. No results found for this or any previous visit (from the past 240 hour(s)).   Studies: No results found.   Scheduled Meds: . allopurinol  300 mg Oral Daily  . aspirin  81 mg Oral Daily  . carvedilol  25 mg Oral BID WC  . clopidogrel  75 mg Oral Daily  . famotidine  10 mg Oral Daily  . folic acid  1 mg Oral Daily  . heparin  5,000 Units Subcutaneous Q8H  . isosorbide dinitrate  20 mg Oral BID  . multivitamin with minerals  1 tablet Oral Daily  . PARoxetine  30 mg Oral BID  . potassium chloride  40 mEq Oral Once  . rosuvastatin  40 mg Oral Daily  . tamsulosin  0.4 mg Oral Daily  . thiamine  100 mg Oral Daily  . Vitamin D (Ergocalciferol)  50,000 Units Oral Weekly   Continuous Infusions: . 0.9 % NaCl with KCl 40 mEq / L 60 mL/hr (08/15/16 0001)   Active Problems:   HYPERLIPIDEMIA   Essential hypertension   Coronary atherosclerosis   Chronic kidney disease   HYPERGLYCEMIA   Encephalopathy acute   Hypokalemia  Time spent:   Irwin Brakeman, MD, FAAFP [facility] Pager [phone]  If 7PM-7AM, please contact night-coverage [email or web address] Password [email or web address] 08/15/2016, 11:19 AM    LOS: 2 days

## 2016-08-15 NOTE — Progress Notes (Signed)
Spoke with spouse and daughter at bedside, patient sleeping. Provided them with Home Hospice list. They are unsure of disposition at this time. Awaiting PT evaluation and Palliative Care Conference. Daughter states they are unable to care for him at home and are thinking that he will need a facility at d/c. Wife states he is up with walker and assistance at this time, at baseline he was independently ambulating but within the last week he has been using a walker, one person assist, very unsteady on his feet. Will continue to follow for d/c needs, CSW notified of possible placement.

## 2016-08-16 MED ORDER — ACETAMINOPHEN 325 MG PO TABS: 650.0000 mg | ORAL_TABLET | Freq: Four times a day (QID) | ORAL | Status: AC | PRN

## 2016-08-16 MED ORDER — QUETIAPINE FUMARATE 25 MG PO TABS: 25.0000 mg | ORAL_TABLET | Freq: Every evening | ORAL | Status: DC | PRN

## 2016-08-16 NOTE — Progress Notes (Addendum)
Speech Language Pathology Treatment: Dysphagia  Patient Details Name: Bryan [name] MRN: [id number] DOB: 02/08/1941 Today's Date: 08/16/2016 Time: 1010-1030 SLP Time Calculation (min) (ACUTE ONLY): 20 min  Assessment / Plan / Recommendation Clinical Impression  Pt more alert, interactive.  Asked to sit EOB while eating.  Demonstrates adequate bolus anticipation/attention, prolonged mastication, no overt s/s of aspiration. BS diminished.  Fed himself with only occasional physical assist. Wife arrived end of session; prefers he is on regular diet so that choices are liberalized. Family is pursuing comfort care; wife prefers to take him home with hospice if he is ambulatory.  No further SLP needs identified - our services will sign off.     HPI HPI: 76 year old male admitted 08/13/16 due to AMS, encephalopathy. PMH significant for CVAs, GERD, hepatitis. New dx of multiple retroperitoneal lymphadenopathy (family wants no oncology consult, wants comfort care).       SLP Plan  Continue with current plan of care     Recommendations  Diet recommendations: Regular;Thin liquid Liquids provided via: Straw;Cup Medication Administration: Whole meds with puree Supervision: Patient able to self feed;Staff to assist with self feeding Compensations: Minimize environmental distractions Postural Changes and/or Swallow Maneuvers: Seated upright 90 degrees                Oral Care Recommendations: Oral care BID Plan: Continue with current plan of care       GO             Bryan [name], [place] CCC/SLP Pager [phone]   Bryan [name] 08/16/2016, 10:37 AM

## 2016-08-16 NOTE — NC FL2 (Addendum)
[place] MEDICAID FL2 LEVEL OF CARE SCREENING TOOL     IDENTIFICATION  Patient Name: Bryan [name] Birthdate: 07/21/1941 Sex: male Admission Date (Current Location): 08/13/2016  [facility] and [place] Number:  Herbalist and Address:  [facility],  [place] [address] [place]      Provider Number: [id number]  Attending Physician Name and Address:  Murlean Iba, MD  Relative Name and Phone Number:       Current Level of Care: Hospital Recommended Level of Care: [place] Prior Approval Number:    Date Approved/Denied:   PASRR Number:  ([id number])  Discharge Plan: SNF    Current Diagnoses: Patient Active Problem List   Diagnosis Date Noted  . Hypokalemia 08/14/2016  . Encephalopathy acute 08/13/2016  . Chest pain with high risk of acute coronary syndrome 08/21/2013  . ANXIETY STATE, UNSPECIFIED 11/18/2009  . MYALGIA 11/18/2009  . CHEST PAIN 07/14/2009  . OTHER DYSPHAGIA 07/14/2009  . FLANK PAIN, RIGHT 10/30/2008  . NEPHROLITHIASIS, HX OF 10/30/2008  . HEADACHE 03/26/2008  . HYPERGLYCEMIA 03/26/2008  . LIPOMAS, MULTIPLE 01/14/2008  . HYPERLIPIDEMIA 08/02/2007  . Essential hypertension 08/02/2007  . Coronary atherosclerosis 08/02/2007  . Chronic kidney disease 08/02/2007  . SYNCOPE 08/02/2007  . UNS ADVRS EFF UNS RX MEDICINAL&BIOLOGICAL SBSTNC 07/30/2007  . LOW BACK PAIN SYNDROME 04/02/2007  . ABDOMINAL PAIN, RECURRENT 04/02/2007  . POLYP, COLON 08/30/2006  . DIVERTICULOSIS, COLON 08/30/2006  . IBS 08/30/2006    Orientation RESPIRATION BLADDER Height & Weight     Self  Normal Incontinent Weight:   Height:     BEHAVIORAL SYMPTOMS/MOOD NEUROLOGICAL BOWEL NUTRITION STATUS   (none )  (none ) Continent Diet (Regular )  AMBULATORY STATUS COMMUNICATION OF NEEDS Skin   Limited Assist Verbally Normal                       Personal Care Assistance Level of Assistance  Dressing, Feeding, Bathing Bathing  Assistance: Maximum assistance Feeding assistance: Limited assistance Dressing Assistance: Maximum assistance     Functional Limitations Info  Speech, Hearing, Sight Sight Info: Adequate Hearing Info: Adequate Speech Info: Adequate    SPECIAL CARE FACTORS FREQUENCY  PT (By licensed PT)     PT Frequency: 3              Contractures      Additional Factors Info  Allergies, Code Status Code Status Info: DNR CODE  Allergies Info:  Iodinated Diagnostic Agents, Metrizamide           Current Medications (08/16/2016):  This is the current hospital active medication list Current Facility-Administered Medications  Medication Dose Route Frequency Provider Last Rate Last Dose  . acetaminophen (TYLENOL) tablet 650 mg  650 mg Oral Q6H PRN Tawni Millers, MD       Or  . acetaminophen (TYLENOL) suppository 650 mg  650 mg Rectal Q6H PRN Tawni Millers, MD      . carvedilol (COREG) tablet 25 mg  25 mg Oral BID WC Clan[place] Marisa Hua, MD   25 mg at 08/16/16 [id number]  . clopidogrel (PLAVIX) tablet 75 mg  75 mg Oral Daily Tawni Millers, MD   75 mg at 08/16/16 [id number]  . haloperidol lactate (HALDOL) injection 2 mg  2 mg Intravenous Q6H PRN Clanford Marisa Hua, MD      . heparin injection 5,000 Units  5,000 Units Subcutaneous Q8H Mauricio Gerome Apley, MD   5,000 Units  at 08/16/16 1311  . isosorbide dinitrate (ISORDIL) tablet 20 mg  20 mg Oral BID Tawni Millers, MD   20 mg at 08/16/16 [id number]  . LORazepam (ATIVAN) injection 0.5 mg  0.5 mg Intravenous Q4H PRN Clanford L Johnson, MD      . ondansetron (ZOFRAN) tablet 4 mg  4 mg Oral Q6H PRN Tawni Millers, MD       Or  . ondansetron [facility]) injection 4 mg  4 mg Intravenous Q6H PRN Tawni Millers, MD   4 mg at 08/16/16 1311  . PARoxetine (PAXIL) tablet 30 mg  30 mg Oral BID Tawni Millers, MD   30 mg at 08/16/16 [id number]  . QUEtiapine (SEROQUEL) tablet 25 mg  25 mg Oral QHS PRN Clanford Marisa Hua, MD    25 mg at 08/15/16 0000  . tamsulosin (FLOMAX) capsule 0.4 mg  0.4 mg Oral Daily Mauricio Gerome Apley, MD   0.4 mg at 08/16/16 [id number]     Discharge Medications: Please see discharge summary for a list of discharge medications.  Relevant Imaging Results:  Relevant Lab Results:   Additional Information SSN [id number]   Will need palliative care at SNF.   Glendon Axe A

## 2016-08-16 NOTE — Clinical Social Work Placement (Signed)
Medical Social Worker facilitated patient discharge including contacting patient family and facility to confirm patient discharge plans.  Clinical information faxed to facility and family agreeable with plan.  MSW arranged ambulance transport via [facility] to [facility].  RN to call report prior to discharge.  Medical Social Worker will sign off for now as social work intervention is no longer needed. Please consult [place] again if new need arises.   CLINICAL SOCIAL WORK PLACEMENT  NOTE  Date:  08/16/2016  Patient Details  Name: Bryan [name] MRN: [id number] Date of Birth: [birth year]  Clinical Social Work is seeking post-discharge placement for this patient at the [place] level of care (*CSW will initial, date and re-position this form in  chart as items are completed):  Yes   Patient/family provided with [employer] Work Department's list of facilities offering this level of care within the geographic area requested by the patient (or if unable, by the patient's family).  Yes   Patient/family informed of their freedom to choose among providers that offer the needed level of care, that participate in Medicare, Medicaid or managed care program needed by the patient, have an available bed and are willing to accept the patient.  Yes   Patient/family informed of [place]'s ownership interest in [facility] and [facility], as well as of the fact that they are under no obligation to receive care at these facilities.  PASRR submitted to EDS on 08/16/16     PASRR number received on 08/16/16     Existing PASRR number confirmed on       FL2 transmitted to all facilities in geographic area requested by pt/family on 08/16/16     FL2 transmitted to all facilities within larger geographic area on       Patient informed that his/her managed care company has contracts with or will negotiate with certain facilities, including the following:         Yes   Patient/family informed of bed offers received.  Patient chooses bed at  [facility] and [facility] )     Physician recommends and patient chooses bed at      Patient to be transferred to  [facility]) on 08/16/16.  Patient to be transferred to facility by  [facility] )     Patient family notified on 08/16/16 of transfer.  Name of family member notified:   (Pt's wife, Bryan [name] )     PHYSICIAN Please prepare priority discharge summary, including medications, Please sign FL2, Please sign DNR, Please prepare prescriptions     Additional Comment:    _______________________________________________ Rozell Searing 08/16/2016, 3:48 PM

## 2016-08-16 NOTE — Clinical Social Work Note (Signed)
Clinical Social Work Assessment  Patient Details  Name: Bryan [name] MRN: [id number] Date of Birth: October 20, 1940  Date of referral:  08/16/16               Reason for consult:  Facility Placement, Discharge Planning                Permission sought to share information with:  Family Supports, Customer service manager, Case Optician, dispensing granted to share information::  Yes, Verbal Permission Granted  Name::      Bryan [name] )  Agency::   ([facility] )  Relationship::   (Spouse )  Contact Information:   [phone])  Housing/Transportation Living arrangements for the past 2 months:  Single Family Home Source of Information:  Adult Children, Spouse Patient Interpreter Needed:  None Criminal Activity/Legal Involvement Pertinent to Current Situation/Hospitalization:  No - Comment as needed Significant Relationships:  Adult Children, Spouse Lives with:  Spouse Do you feel safe going back to the place where you live?  No Need for family participation in patient care:  Yes (Comment)  Care giving concerns: Patient admitted from home with with spouse and requiring increased assistance with ambulation and ALD's.    Social Worker assessment / plan: MSW met with patient, wife and dtr present at bedside in reference to post acute placement. MSW introduced MSW role and SNF placement. MSW reviewed and provided SNF list. MSW also explained insurance coverage and need for participation with therapy. Patient's dtr reported that she is familiar with [facility] and would like referrals sent to both [place] and [facility]. Pt's family understands that patient is will enter into SNF under STR benefit with palliative care as recommended by palliative care team at [facility]. No further concerns reported at this time. MSW will continue to follow pt and pt's family for continued support and to facilitate pt's discharge needs once medically stable.   Employment  status:  Retired Nurse, adult PT Recommendations:  [place] / Referral to community resources:  [place]  Patient/Family's Response to care: Patient alert and oriented to person only. Pt's wife and dtr agreeable to SNF placement with palliative services. Pt's wife and dtr supportive and strongly involved in pt's care. Wife and dtr appreciated social work intervention.   Patient/Family's Understanding of and Emotional Response to Diagnosis, Current Treatment, and Prognosis:  Patient's family knowledgeable of palliative care recommendations and medical interventions.    Emotional Assessment Appearance:  Appears stated age Attitude/Demeanor/Rapport:  Unable to Assess Affect (typically observed):  Unable to Assess Orientation:  Oriented to Self Alcohol / Substance use:  Not Applicable Psych involvement (Current and /or in the community):  No (Comment)  Discharge Needs  Concerns to be addressed:  Denies Needs/Concerns at this time Readmission within the last 30 days:  No Current discharge risk:  Dependent with Mobility, Terminally ill Barriers to Discharge:  Continued Medical Work up   Glendon Axe A 08/16/2016, 3:38 PM

## 2016-08-16 NOTE — Progress Notes (Signed)
Pt discharged via stretcher accompanied by [facility] paramedics x 2 to meet awaiting ambulance for transport to [facility] facility. Report called to [facility], RN, at facility earlier. VSS. Pt calm and cooperative.

## 2016-08-16 NOTE — Progress Notes (Signed)
Report called to [employer] and given to [place], RN regarding pt status, hx and reason for transfer. Awaiting for ambulance to arrive for transport to facility.

## 2016-08-16 NOTE — Progress Notes (Signed)
PROGRESS NOTE    Bryan [name]  [id number]  DOB: 1941-01-23  DOA: 08/13/2016 PCP: Unice Cobble, MD  Hospital course: Bryan [name] is a 76 y.o. 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 He was admitted to the outside hospital last night with the working diagnosis of encephalopathy complicated by hypercalcemia and dehydration. Apparently for last 3 days patient has been confused and disoriented, this has been associated with generalized weakness, he had to use a walker for ambulation. Overall generalized malaise and decreased appetite. He was seen by his primary care physician who recommended hospitalization to rule out CVA. As a part of the workup he went CT of the abdomen and pelvis showing multiple retroperitoneal lymphadenopathy. Concern about malignancy was entertained, no oncology specialist at [facility], patient was transferred for further evaluation and possible oncology consultation.   Assessment & Plan:  1. Dehydration and hypercalcemia. Continue gentle IVF and encourage oral drinking, hypercalcemia has resolved, Will hold on diuretics and ACE inhibitor  2. Acute encephalopathy. Improving. Likely secondary to acute CVA as seen on MRI from [facility].  I had long discussion with family and they have decided to pursue comfort and dignity care.  They asked to try to limit medications and lab draws.  He received gentle IV fluids, correction of electrolytes and IVFs discontinued now. Pt is eating and drinking.  PT recommending SNF. Patient does have CVAs the past, patient is high-risk for developing delirium.  Pt is DNR/DNI per wife and daughter.  Asked palliative team to help with comfort care orders and helping with determining appropriate disposition. I  have tried to limit medications to absolute essentials per family request.   3. Retroperitoneal lymphadenopathy. I discussed with family and they decided  not to pursue this further.  They want to focus on comfort and dignity care and not get an oncology consult and not interested in pursuing biopsy, radiation or chemotherapy.  4. Coronary disease status post CABG.    5. Hypertension.  6. Depression. Continue paroxetine as he becomes severely agitated without it.    Subjective: Pt asking about going home.  He has been eating better and sleeping better with seroquel tablet given at night.    Objective: Vitals:   08/15/16 1445 08/15/16 2106 08/16/16 0514 08/16/16 1339  BP: 110/62 131/65 (!) 118/55 (!) 112/50  Pulse: 65 64 62 61  Resp: 16 16 18 18   Temp: 98.2 F (36.8 C) 97.5 F (36.4 C) 98.4 F (36.9 C) 98 F (36.7 C)  TempSrc: Oral Oral Oral Oral  SpO2: 97% 97% 99% 95%    Intake/Output Summary (Last 24 hours) at 08/16/16 1353 Last data filed at 08/16/16 1339  Gross per 24 hour  Intake              900 ml  Output             1750 ml  Net             -850 ml   There were no vitals filed for this visit.  Exam:  General exam: elderly male, chronically ill appearing, confused Respiratory system:  No increased work of breathing. Cardiovascular system: S1 & S2 heard Gastrointestinal system: Abdomen is nondistended, soft and nontender. Normal bowel sounds heard. Central nervous system: Awake, alert. Moving all extremities. Extremities: no cyanosis.  Data Reviewed: Basic Metabolic Panel:  Recent Labs Lab 08/13/16 1910 08/14/16 0753 08/15/16 0424  NA 140 140 139  K 2.8* 2.8* 3.0*  CL 110 115* 115*  CO2 23 20* 20*  GLUCOSE 133* 117* 108*  BUN 29* 27* 23*  CREATININE 1.97* 1.72* 1.77*  CALCIUM 10.1 10.3 10.6*  MG 1.9  --   --   PHOS  --   --  2.3*   Liver Function Tests:  Recent Labs Lab 08/13/16 1910 08/14/16 0753 08/15/16 0424  AST 89* 87*  --   ALT 61 58  --   ALKPHOS 198* 177*  --   BILITOT 2.0* 2.3*  --   PROT 5.8* 5.8*  --   ALBUMIN 2.8* 2.8* 2.6*   No results for input(s): LIPASE, AMYLASE in the  last 168 hours. No results for input(s): AMMONIA in the last 168 hours. CBC:  Recent Labs Lab 08/13/16 1910 08/14/16 0753  WBC 5.1 4.8  NEUTROABS 3.2  --   HGB 12.3* 11.6*  HCT 34.4* 33.4*  MCV 88.7 87.7  PLT 99* 103*   Cardiac Enzymes: No results for input(s): CKTOTAL, CKMB, CKMBINDEX, TROPONINI in the last 168 hours. CBG (last 3)  No results for input(s): GLUCAP in the last 72 hours. No results found for this or any previous visit (from the past 240 hour(s)).   Studies: No results found.   Scheduled Meds: . carvedilol  25 mg Oral BID WC  . clopidogrel  75 mg Oral Daily  . heparin  5,000 Units Subcutaneous Q8H  . isosorbide dinitrate  20 mg Oral BID  . PARoxetine  30 mg Oral BID  . tamsulosin  0.4 mg Oral Daily   Continuous Infusions:  Active Problems:   HYPERLIPIDEMIA   Essential hypertension   Coronary atherosclerosis   Chronic kidney disease   HYPERGLYCEMIA   Encephalopathy acute   Hypokalemia  Time spent:   Irwin Brakeman, MD, FAAFP [facility] Pager [phone]  If 7PM-7AM, please contact night-coverage [email or web address] Password [email or web address] 08/16/2016, 1:53 PM    LOS: 3 days

## 2016-08-16 NOTE — Discharge Summary (Signed)
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 He was admitted to the outside hospital last night with the working diagnosis of encephalopathy complicated by hypercalcemia and dehydration. Apparently for last 3 days patient has been confused and disoriented, this has been associated with generalized weakness, he had to use a walker for ambulation. Overall generalized malaise and decreased appetite. He was seen by his primary care physician who recommended hospitalization to rule out CVA. As a part of the workup he went CT of the abdomen and pelvis showing multiple retroperitoneal lymphadenopathy.  Both patient's wife and daughter are able to endorse that the patient would not want invasive workup, would not want aggressive measures, over all, they wish to provide him with a rehab attempt, attempt to improve his strength, function, eating. If he is not able to improve, then they are clear on their goals for seeking hospice type care and focusing exclusively on comfort.   1. Dehydration and hypercalcemia.Resolved now. Continue to encourage oral drinking, hypercalcemia has resolved 2. Acute encephalopathy. Improving. Likely secondary to acute CVA as seen on MRI from [facility].  I had long discussion with family and they have decided to pursue comfort and dignity care.  They would like for him to have an attempt at rehabilitation.  They asked to try to limit medications and lab draws.  He received gentle IV fluids, correction of electrolytes and IVFs discontinued now. Pt is  eating and drinking.  PT recommending SNF. Patient does have CVAs the past, patient is high-risk for developing delirium.  Pt is DNR/DNI per wife and daughter.  Asked palliative team to help with comfort care orders and helping with determining appropriate disposition. I  have tried to limit medications to absolute essentials per family request.  3. Retroperitoneal lymphadenopathy. I discussed with family and they decided not to pursue this further.  They want to focus on comfort and dignity care and not get an oncology consult and not interested in pursuing biopsy, radiation or chemotherapy. 4. Coronary disease status post CABG.   5. Hypertension. 6. Depression.Continue paroxetine as he becomes severely agitated without it.    Discharge Diagnoses:  Active Problems:   HYPERLIPIDEMIA   Essential hypertension   Coronary atherosclerosis   Chronic kidney disease   HYPERGLYCEMIA   Encephalopathy acute   Hypokalemia  Discharge Instructions  Allergies as of 08/16/2016      Reactions   Iodinated Diagnostic Agents Hives   Metrizamide Hives      Medication List    STOP taking these medications   allopurinol 300 MG tablet Commonly known as:  ZYLOPRIM   aspirin 81 MG tablet   Choline Fenofibrate 135 MG capsule Commonly known as:  TRILIPIX   DOCOSAHEXAENOIC ACID PO   FIBER PO   furosemide 20 MG tablet Commonly known as:  LASIX   gabapentin 100 MG capsule Commonly known as:  NEURONTIN   glycopyrrolate 2 MG tablet Commonly known as:  ROBINUL   isosorbide dinitrate 20 MG tablet Commonly known as:  ISORDIL   losartan 50 MG tablet Commonly known as:  COZAAR   nitroGLYCERIN 0.4 MG SL tablet Commonly known as:  NITROSTAT  ONE TOUCH TEST STRIPS test strip Generic drug:  glucose blood   rosuvastatin 40 MG tablet Commonly known as:  CRESTOR   Vitamin D (Ergocalciferol) 50000 units Caps capsule Commonly known as:  DRISDOL     TAKE these medications   acetaminophen 325  MG tablet Commonly known as:  TYLENOL Take 2 tablets (650 mg total) by mouth every 6 (six) hours as needed for mild pain (or Fever >/= 101).   carvedilol 25 MG tablet Commonly known as:  COREG 25 mg 3 (three) times daily.   clopidogrel 75 MG tablet Commonly known as:  PLAVIX Take 75 mg by mouth daily.   PARoxetine 30 MG tablet Commonly known as:  PAXIL TAKE 1 TABLET BY MOUTH TWICE DAILY   QUEtiapine 25 MG tablet Commonly known as:  SEROQUEL Take 1 tablet (25 mg total) by mouth at bedtime as needed (sleep, agitation).   ranitidine 150 MG tablet Commonly known as:  ZANTAC Take 1 tablet (150 mg total) by mouth 2 (two) times daily.   tamsulosin 0.4 MG Caps capsule Commonly known as:  FLOMAX Take 0.4 mg by mouth daily.            Durable Medical Equipment        Start     Ordered   08/15/16 1608  For home use only DME Walker rolling  Once    Question Answer Comment  Patient needs a walker to treat with the following condition General weakness   Patient needs a walker to treat with the following condition Gait instability      08/15/16 1607      Allergies  Allergen Reactions  . Iodinated Diagnostic Agents Hives  . Metrizamide Hives   Discharge Exam: Vitals:   08/16/16 0514 08/16/16 1339  BP: (!) 118/55 (!) 112/50  Pulse: 62 61  Resp: 18 18  Temp: 98.4 F (36.9 C) 98 F (36.7 C)   Vitals:   08/15/16 1445 08/15/16 2106 08/16/16 0514 08/16/16 1339  BP: 110/62 131/65 (!) 118/55 (!) 112/50  Pulse: 65 64 62 61  Resp: 16 16 18 18   Temp: 98.2 F (36.8 C) 97.5 F (36.4 C) 98.4 F (36.9 C) 98 F (36.7 C)  TempSrc: Oral Oral Oral Oral  SpO2: 97% 97% 99% 95%   The results of significant diagnostics from this hospitalization (including imaging, microbiology, ancillary and laboratory) are listed below for reference.    Microbiology: No results found for this or any previous visit (from the past 240 hour(s)).   Labs: BNP (last 3 results) No results for  input(s): BNP in the last 8760 hours. Basic Metabolic Panel:  Recent Labs Lab 08/13/16 1910 08/14/16 0753 08/15/16 0424  NA 140 140 139  K 2.8* 2.8* 3.0*  CL 110 115* 115*  CO2 23 20* 20*  GLUCOSE 133* 117* 108*  BUN 29* 27* 23*  CREATININE 1.97* 1.72* 1.77*  CALCIUM 10.1 10.3 10.6*  MG 1.9  --   --   PHOS  --   --  2.3*   Liver Function Tests:  Recent Labs Lab 08/13/16 1910 08/14/16 0753 08/15/16 0424  AST 89* 87*  --   ALT 61 58  --   ALKPHOS 198* 177*  --   BILITOT 2.0* 2.3*  --   PROT 5.8* 5.8*  --   ALBUMIN 2.8* 2.8* 2.6*   No results for input(s): LIPASE, AMYLASE in the last 168 hours. No results for input(s): AMMONIA in the last 168 hours. CBC:  Recent Labs Lab 08/13/16 1910 08/14/16 0753  WBC 5.1 4.8  NEUTROABS 3.2  --   HGB 12.3* 11.6*  HCT 34.4* 33.4*  MCV 88.7 87.7  PLT 99* 103*   Cardiac Enzymes: No results for input(s): CKTOTAL, CKMB, CKMBINDEX, TROPONINI in the last 168 hours. BNP: Invalid input(s): POCBNP CBG: No results for input(s): GLUCAP in the last 168 hours. D-Dimer No results for input(s): DDIMER in the last 72 hours. Hgb A1c No results for input(s): HGBA1C in the last 72 hours. Lipid Profile No results for input(s): CHOL, HDL, LDLCALC, TRIG, CHOLHDL, LDLDIRECT in the last 72 hours. Thyroid function studies  Recent Labs  08/13/16 1852  TSH 1.648   Anemia work up No results for input(s): VITAMINB12, FOLATE, FERRITIN, TIBC, IRON, RETICCTPCT in the last 72 hours. Urinalysis    Component Value Date/Time   COLORURINE YELLOW 08/13/2016 2240   APPEARANCEUR HAZY (A) 08/13/2016 2240   LABSPEC 1.017 08/13/2016 2240   PHURINE 5.0 08/13/2016 2240   GLUCOSEU 50 (A) 08/13/2016 2240   HGBUR LARGE (A) 08/13/2016 2240   HGBUR negative 10/30/2008 1444   BILIRUBINUR NEGATIVE 08/13/2016 2240   KETONESUR NEGATIVE 08/13/2016 2240   PROTEINUR 30 (A) 08/13/2016 2240   UROBILINOGEN 0.2 10/30/2008 1444   NITRITE NEGATIVE 08/13/2016 2240    LEUKOCYTESUR NEGATIVE 08/13/2016 2240   Sepsis Labs Invalid input(s): PROCALCITONIN,  WBC,  LACTICIDVEN Microbiology No results found for this or any previous visit (from the past 240 hour(s)).  Time coordinating discharge: 32 minutes  SIGNED:  Irwin Brakeman, MD  [facility] 08/16/2016, 3:34 PM Pager   If 7PM-7AM, please contact night-coverage [email or web address] Password [email or web address]

## 2016-08-16 NOTE — Consult Note (Signed)
Consultation Note Date: 08/16/2016   Patient Name: Bryan [name]  DOB: [birth year]  MRN: [id number]  Age / Sex: 76 y.o., male  PCP: Hendricks Limes, MD Referring Physician: Murlean Iba, MD  Reason for Consultation: Establishing goals of care  HPI/Patient Profile: 76 y.o. 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 Patient was initially taken to his PCP office, then sent to ED at [place], [place].   Patient's MRI report from outside hospital: area of minimal restriction posterior periventricular white matter on the right. This could be infarct or mass. Minimal strandy edema. Old infarcts right frontal lobe and left inferior cerebellum, with minimal associated encephalomalacia. Mild age-appropriate atrophy    Patient has since been transferred to [facility], lives with wife in [place], [place], daughter lives in [place], [place]. They are familiar with Hospice home in [facility]. Patient has been admitted here with dehydration, hypercalcemia, acute encephalopathy, retroperitoneal lymphadenopathy. Patient has CAD, he is s/p CABG, HTN, depression for which he requires Paxil routinely.   I met with the patient, I introduced myself and palliative care as follows: Palliative medicine is specialized medical care for people living with serious illness. It focuses on providing relief from the symptoms and stress of a serious illness. The goal is to improve quality of life for both the patient and the family.  Both patient's wife and daughter are able to endorse that the patient would not want invasive workup, would not  want aggressive measures, over all, they wish to provide him with a rehab attempt, attempt to improve his strength, function, eating. If he is not able to improve, then they are clear on their goals for seeking hospice type care and focusing exclusively on comfort.   See recommendations below, thank you for the consult.  HCPOA Wife daughter.    SUMMARY OF RECOMMENDATIONS    DNR DNI SNF on discharge, daughter reviewing list provided by CSW. Recommend SNF with palliative services on D/C Recommend full scope of hospice services afterwards: either home with hospice or residential hospice after short rehab attempt.  Overall goals are for comfort measures, no aggressive measures, no work up  [place]:  DNR    Symptom Management:    continue current treatment.   Palliative Prophylaxis:   Delirium Protocol  Additional Recommendations (Limitations, Scope, Preferences):  Full Comfort Care  Psycho-social/Spiritual:   Desire for further Chaplaincy support:no  Additional Recommendations: Education on Hospice  Prognosis:   < 3 months  Discharge Planning: [place] for rehab with Palliative care service follow-up      Primary Diagnoses: Present on Admission: . Encephalopathy acute . Hypokalemia . Coronary atherosclerosis . HYPERGLYCEMIA . HYPERLIPIDEMIA . Chronic kidney disease . Essential  hypertension   I have reviewed the medical record, interviewed the patient and family, and examined the patient. The following aspects are pertinent.  Past Medical History:  Diagnosis Date  . Abdominal pain, recurrent    also epigastric...also IBS.....  . Abnormal blood chemistry   . Acute sprain or strain of cervical region   . CAD (coronary artery disease)   . Contusion of left foot   . Diverticulitis    PMH of  . Diverticulosis of colon    Dr Fuller Plan  . GERD (gastroesophageal reflux disease)   . Gout   . Hemorrhoids   . Hepatitis     . Hyperlipidemia   . Hypertension    unspecified  . Kidney stones    Dr Thomasene Mohair  . Low back pain   . Nephrolithiasis    hx of PMH of x4  . Renal insufficiency    chronic  . Syncope   . Tubular adenoma of colon 07/2005   Social History   Social History  . Marital status: Married    Spouse name: N/A  . Number of children: 2  . Years of education: N/A   Occupational History  . Retired    Social History Main Topics  . Smoking status: Never Smoker  . Smokeless tobacco: Never Used  . Alcohol use No  . Drug use: No  . Sexual activity: Not Asked   Other Topics Concern  . None   Social History Narrative   Daily caffeine    Family History  Problem Relation Age of Onset  . Stroke Mother 34  . Hypertension Mother   . Heart disease Mother   . Breast cancer Mother   . Diabetes Mother   . Heart attack Father 74  . Heart attack Sister 46  . Alcohol abuse Brother     1 brother  . Heart attack Brother 65  . Heart attack Brother 58  . Heart attack Brother      > 55  . Colon cancer Neg Hx    Scheduled Meds: . carvedilol  25 mg Oral BID WC  . clopidogrel  75 mg Oral Daily  . heparin  5,000 Units Subcutaneous Q8H  . isosorbide dinitrate  20 mg Oral BID  . PARoxetine  30 mg Oral BID  . tamsulosin  0.4 mg Oral Daily   Continuous Infusions: PRN Meds:.acetaminophen **OR** acetaminophen, haloperidol lactate, LORazepam, ondansetron **OR** ondansetron (ZOFRAN) IV, QUEtiapine Medications Prior to Admission:  Prior to Admission medications   Medication Sig Start Date End Date Taking? Authorizing Provider  allopurinol (ZYLOPRIM) 300 MG tablet Take 1 tablet by mouth daily. 08/16/13  Yes Historical Provider, MD  aspirin 81 MG tablet Take 81 mg by mouth daily.     Yes Historical Provider, MD  carvedilol (COREG) 25 MG tablet 25 mg 3 (three) times daily.    Yes Historical Provider, MD  clopidogrel (PLAVIX) 75 MG tablet Take 75 mg by mouth daily.   Yes Historical Provider, MD   DOCOSAHEXAENOIC ACID PO Take 1,000 mg by mouth daily. 06/27/16  Yes Historical Provider, MD  FIBER PO Take by mouth. 2 tabs daily    Yes Historical Provider, MD  furosemide (LASIX) 20 MG tablet Take 20 mg by mouth 3 (three) times a week. 08/09/16  Yes Historical Provider, MD  isosorbide dinitrate (ISORDIL) 20 MG tablet Take 20 mg by mouth 2 (two) times daily.   Yes Historical Provider, MD  nitroGLYCERIN (NITROSTAT) 0.4 MG SL tablet Place 0.4 mg under  the tongue as needed.     Yes Historical Provider, MD  PARoxetine (PAXIL) 30 MG tablet TAKE 1 TABLET BY MOUTH TWICE DAILY 01/27/14  Yes Hendricks Limes, MD  ranitidine (ZANTAC) 150 MG tablet Take 1 tablet (150 mg total) by mouth 2 (two) times daily. 03/04/13  Yes Hendricks Limes, MD  rosuvastatin (CRESTOR) 40 MG tablet TAKE 1 TABLET BY MOUTH EVERY DAY 06/02/14  Yes Hendricks Limes, MD  tamsulosin (FLOMAX) 0.4 MG CAPS capsule Take 0.4 mg by mouth daily. 04/17/14  Yes Historical Provider, MD  Vitamin D, Ergocalciferol, (DRISDOL) 50000 units CAPS capsule Take 50,000 Units by mouth once a week. 07/11/16  Yes Historical Provider, MD  Choline Fenofibrate (TRILIPIX) 135 MG capsule Take 1 capsule (135 mg total) by mouth daily. Patient not taking: Reported on 08/13/2016 11/20/13   Hendricks Limes, MD  gabapentin (NEURONTIN) 100 MG capsule Take 1 capsule (100 mg total) by mouth as directed. 1 pill every 8 hours as needed for nerve pain Patient not taking: Reported on 08/13/2016 09/13/11 07/25/97  Hendricks Limes, MD  glucose blood (ONE TOUCH TEST STRIPS) test strip 1 each by Other route as needed. Onetouch ultra test strip.Marland KitchenMarland KitchenProvider, please review the patient's drug allergy history. Some issues need clarification. Blood sugar once daily      Historical Provider, MD  glycopyrrolate (ROBINUL) 2 MG tablet Take 1 tablet (2 mg total) by mouth 2 (two) times daily. Patient not taking: Reported on 08/13/2016 12/26/11 07/25/97  Ladene Artist, MD  losartan (COZAAR) 50 MG tablet  TAKE 2 TABLETS EVERY DAY Patient not taking: Reported on 08/13/2016 05/20/13   Hendricks Limes, MD   Allergies  Allergen Reactions  . Iodinated Diagnostic Agents Hives  . Metrizamide Hives   Review of Systems Confused.   Physical Exam Elderly gentleman some what confused NAD Chronically ill appearing.  S1 S2 Clear Abdomen soft No edema Awakens, is able to state his name. Is able to answer few yes/no type questions appropriately.   Vital Signs: BP (!) 112/50 (BP Location: Right Arm)   Pulse 61   Temp 98 F (36.7 C) (Oral)   Resp 18   SpO2 95%  Pain Assessment: No/denies pain   Pain Score: 0-No pain   SpO2: SpO2: 95 % O2 Device:SpO2: 95 % O2 Flow Rate: .   IO: Intake/output summary:  Intake/Output Summary (Last 24 hours) at 08/16/16 1354 Last data filed at 08/16/16 1339  Gross per 24 hour  Intake              900 ml  Output             1750 ml  Net             -850 ml    LBM: Last BM Date: 08/13/16 Baseline Weight:   Most recent weight:       Palliative Assessment/Data:   Flowsheet Rows   Flowsheet Row Most Recent Value  Intake Tab  Referral Department  Hospitalist  Unit at Time of Referral  Med/Surg Unit  Palliative Care Primary Diagnosis  Cancer  Date Notified  08/15/16  Palliative Care Type  New Palliative care  Reason for referral  Clarify Goals of Care  Date of Admission  08/13/16  Date first seen by Palliative Care  08/16/16  # of days IP prior to Palliative referral  2  Clinical Assessment  Palliative Performance Scale Score  30%  Pain Max last 24 hours  4  Pain Min Last 24 hours  3  Dyspnea Max Last 24 Hours  4  Dyspnea Min Last 24 hours  3  Nausea Max Last 24 Hours  4  Nausea Min Last 24 Hours  3  Anxiety Max Last 24 Hours  3  Psychosocial & Spiritual Assessment  Palliative Care Outcomes  Patient/Family meeting held?  Yes  Who was at the meeting?  wife daughter   Palliative Care Outcomes  Clarified goals of care      Time In:   12.55 Time Out:  1.55 Time Total: 60  Greater than 50%  of this time was spent counseling and coordinating care related to the above assessment and plan.  Signed by: Loistine Chance, MD  [phone]  Please contact Palliative Medicine Team phone at [phone] for questions and concerns.  For individual provider: See Shea Evans

## 2016-08-17 ENCOUNTER — Non-Acute Institutional Stay (SKILLED_NURSING_FACILITY): Payer: Medicare Other | Admitting: Internal Medicine

## 2016-08-17 ENCOUNTER — Encounter: Payer: Self-pay | Admitting: Internal Medicine

## 2016-08-17 DIAGNOSIS — I639 Cerebral infarction, unspecified: Secondary | ICD-10-CM

## 2016-08-17 DIAGNOSIS — F32A Depression, unspecified: Secondary | ICD-10-CM

## 2016-08-17 DIAGNOSIS — E860 Dehydration: Secondary | ICD-10-CM | POA: Diagnosis not present

## 2016-08-17 DIAGNOSIS — I10 Essential (primary) hypertension: Secondary | ICD-10-CM | POA: Diagnosis not present

## 2016-08-17 DIAGNOSIS — N400 Enlarged prostate without lower urinary tract symptoms: Secondary | ICD-10-CM | POA: Diagnosis not present

## 2016-08-17 DIAGNOSIS — G9340 Encephalopathy, unspecified: Secondary | ICD-10-CM | POA: Diagnosis not present

## 2016-08-17 DIAGNOSIS — F329 Major depressive disorder, single episode, unspecified: Secondary | ICD-10-CM

## 2016-08-17 DIAGNOSIS — R590 Localized enlarged lymph nodes: Secondary | ICD-10-CM | POA: Diagnosis not present

## 2016-08-17 DIAGNOSIS — I25810 Atherosclerosis of coronary artery bypass graft(s) without angina pectoris: Secondary | ICD-10-CM

## 2016-08-17 NOTE — Progress Notes (Addendum)
: Provider:  Noah Delaine. Sheppard Coil, MD Location:  [place] Room Number: [id number] Place of Service:  SNF ([phone])  PCP: Unice Cobble, MD Patient Care Team: Hendricks Limes, MD as PCP - General  Extended Emergency Contact Information Primary Emergency Contact: Alba Destine Address: [place], [place] Phone: [phone] Relation: Other     Allergies: Iodinated diagnostic agents and Metrizamide  Chief Complaint  Patient presents with  . New Admit To SNF    Admit to Facility    HPI: Patient is 76 y.o. male with CAD, s/p 12 stents and multiple old CVAs who was admitted to [facility] from 1/20-23 as a transfer from [facility]. He was admitted to the outside hospital last night with the working diagnosis of encephalopathy complicated by hypercalcemia and dehydration. Apparently for last 3 days patient has been confused and disoriented, this has been associated with generalized weakness, he had to use a walker for ambulation. Overall generalized malaise and decreased appetite. He was seen by his primary care physician who recommended hospitalization to rule out CVA. As a part of the workup he had aN MRI of the head showing small but and acute CVA  And a CT of the abdomen and pelvis showing multiple retroperitoneal lymphadenopathy.  Both patient's wife and daughter are able to endorse that the patient would not want invasive workup, would not want aggressive measures, over all, they wish to provide him with a rehab attempt, attempt to improve his strength, function, eating. If he is not able to improve, then they are clear on their goals for seeking hospice type care and focusing exclusively on comfort. Pt is admitted to SNF for OT/PT. While at SNF pt will be followed for CAD, tx with plavix, coreg, BPH, tx with flomax and depression , tx with paxil.  Past Medical History:  Diagnosis Date  . Abdominal pain, recurrent    also  epigastric...also IBS.....  . Abnormal blood chemistry   . Acute sprain or strain of cervical region   . CAD (coronary artery disease)   . Contusion of left foot   . Diverticulitis    PMH of  . Diverticulosis of colon    Dr Fuller Plan  . GERD (gastroesophageal reflux disease)   . Gout   . Hemorrhoids   . Hepatitis   . Hyperlipidemia   . Hypertension    unspecified  . Kidney stones    Dr Thomasene Mohair  . Low back pain   . Nephrolithiasis    hx of PMH of x4  . Renal insufficiency    chronic  . Syncope   . Tubular adenoma of colon 07/2005    Past Surgical History:  Procedure Laterality Date  . ANKLE SURGERY    . APPENDECTOMY    . CARDIAC CATHETERIZATION  10/22/2012   Dr Atilano Median; 2  100 % blockages posterior aspect of heart  . CHOLECYSTECTOMY    . COLON SURGERY  2005   left hemicolectomy for diverticulitis  . colonoscopy with polypectomy     Dr Fuller Plan  . CORONARY ANGIOPLASTY  12/2011   catheter balloon ruptured  . CORONARY ANGIOPLASTY WITH STENT PLACEMENT     total 10 stents  . CORONARY ARTERY BYPASS GRAFT    . INCISIONAL HERNIA REPAIR  2012   Dr Raul Del; with mesh , Laparscopically  . PTCA     multiple times  . renal calculi  x2   . SMALL INTESTINE SURGERY  2006   3 feet resected at ventral hernia surgery    Allergies as of 08/17/2016      Reactions   Iodinated Diagnostic Agents Hives   Metrizamide Hives      Medication List       Accurate as of 08/17/16  9:24 AM. Always use your most recent med list.          acetaminophen 325 MG tablet Commonly known as:  TYLENOL Take 2 tablets (650 mg total) by mouth every 6 (six) hours as needed for mild pain (or Fever >/= 101).   carvedilol 25 MG tablet Commonly known as:  COREG 25 mg 3 (three) times daily.   clopidogrel 75 MG tablet Commonly known as:  PLAVIX Take 75 mg by mouth daily.   PARoxetine 30 MG tablet Commonly known as:  PAXIL TAKE 1 TABLET BY MOUTH TWICE DAILY   QUEtiapine 25 MG tablet Commonly known  as:  SEROQUEL Take 1 tablet (25 mg total) by mouth at bedtime as needed (sleep, agitation).   ranitidine 150 MG tablet Commonly known as:  ZANTAC Take 1 tablet (150 mg total) by mouth 2 (two) times daily.   tamsulosin 0.4 MG Caps capsule Commonly known as:  FLOMAX Take 0.4 mg by mouth daily.       No orders of the defined types were placed in this encounter.   Immunization History  Administered Date(s) Administered  . Influenza Split 04/22/2011, 05/08/2012  . Influenza Whole 06/13/2007, 06/11/2008, 05/06/2009, 05/08/2013  . Influenza-Unspecified 04/15/2014, 05/22/2015  . PPD Test 08/16/2016  . Pneumococcal Conjugate-13 09/01/2014  . Pneumococcal Polysaccharide-23 07/09/2008  . Tdap 07/05/2012, 11/22/2013    Social History  Substance Use Topics  . Smoking status: Never Smoker  . Smokeless tobacco: Never Used  . Alcohol use No    Family history is   Family History  Problem Relation Age of Onset  . Stroke Mother 33  . Hypertension Mother   . Heart disease Mother   . Breast cancer Mother   . Diabetes Mother   . Heart attack Father 94  . Heart attack Sister 68  . Alcohol abuse Brother     1 brother  . Heart attack Brother 59  . Heart attack Brother 18  . Heart attack Brother      > 55  . Colon cancer Neg Hx       Review of Systems  DATA OBTAINED: from patient- minimal participTION, nurse GENERAL:  no fevers, fatigue, appetite changes SKIN: No itching, or rash EYES: No eye pain, redness, discharge EARS: No earache, tinnitus, change in hearing NOSE: No congestion, drainage or bleeding  MOUTH/THROAT: No mouth or tooth pain, No sore throat RESPIRATORY: No cough, wheezing, SOB CARDIAC: No chest pain, palpitations, lower extremity edema  GI: No abdominal pain, No N/V/D or constipation, No heartburn or reflux  GU: No dysuria, frequency or urgency, or incontinence  MUSCULOSKELETAL: No unrelieved bone/joint pain NEUROLOGIC: No headache, dizziness or focal  weakness; confusion PSYCHIATRIC: No c/o anxiety or sadness   Vitals:   08/17/16 0842  BP: (!) 112/53  Pulse: 68  Resp: 20  Temp: 97.3 F (36.3 C)    SpO2 Readings from Last 1 Encounters:  08/17/16 98%   There is no height or weight on file to calculate BMI.     Physical Exam  GENERAL APPEARANCE: Alert, min conversant,  No acute distress.  SKIN: No diaphoresis rash HEAD: Normocephalic, atraumatic  EYES:  Conjunctiva/lids clear. Pupils round, reactive. EOMs intact.  EARS: External exam WNL, canals clear. Hearing grossly normal.  NOSE: No deformity or discharge.  MOUTH/THROAT: Lips w/o lesions  RESPIRATORY: Breathing is even, unlabored. Lung sounds are clear   CARDIOVASCULAR: Heart RRR no murmurs, rubs or gallops. No peripheral edema.   GASTROINTESTINAL: Abdomen is soft, non-tender, not distended w/ normal bowel sounds. GENITOURINARY: Bladder non tender, not distended  MUSCULOSKELETAL: No abnormal joints or musculature NEUROLOGIC:  Cranial nerves 2-12 grossly intact. Moves all extremities  PSYCHIATRIC: confusion, no behavioral issues  Patient Active Problem List   Diagnosis Date Noted  . Hypokalemia 08/14/2016  . Encephalopathy acute 08/13/2016  . Chest pain with high risk of acute coronary syndrome 08/21/2013  . ANXIETY STATE, UNSPECIFIED 11/18/2009  . MYALGIA 11/18/2009  . CHEST PAIN 07/14/2009  . OTHER DYSPHAGIA 07/14/2009  . FLANK PAIN, RIGHT 10/30/2008  . NEPHROLITHIASIS, HX OF 10/30/2008  . HEADACHE 03/26/2008  . HYPERGLYCEMIA 03/26/2008  . LIPOMAS, MULTIPLE 01/14/2008  . HYPERLIPIDEMIA 08/02/2007  . Essential hypertension 08/02/2007  . Coronary atherosclerosis 08/02/2007  . Chronic kidney disease 08/02/2007  . SYNCOPE 08/02/2007  . UNS ADVRS EFF UNS RX MEDICINAL&BIOLOGICAL SBSTNC 07/30/2007  . LOW BACK PAIN SYNDROME 04/02/2007  . ABDOMINAL PAIN, RECURRENT 04/02/2007  . POLYP, COLON 08/30/2006  . DIVERTICULOSIS, COLON 08/30/2006  . IBS 08/30/2006       Labs reviewed: Basic Metabolic Panel:    Component Value Date/Time   NA 139 08/15/2016 0424   NA 139 08/15/2016 0424   K 3.0 (L) 08/15/2016 0424   CL 115 (H) 08/15/2016 0424   CO2 20 (L) 08/15/2016 0424   GLUCOSE 108 (H) 08/15/2016 0424   BUN 23 (H) 08/15/2016 0424   BUN 23 (A) 08/15/2016 0424   CREATININE 1.77 (H) 08/15/2016 0424   CREATININE 1.8 (A) 08/15/2016 0424   CREATININE 2.17 (H) 10/26/2012 1617   CALCIUM 10.6 (H) 08/15/2016 0424   PROT 5.8 (L) 08/14/2016 0753   ALBUMIN 2.6 (L) 08/15/2016 0424   AST 87 (H) 08/14/2016 0753   ALT 58 08/14/2016 0753   ALKPHOS 177 (H) 08/14/2016 0753   BILITOT 2.3 (H) 08/14/2016 0753   GFRNONAA 36 (L) 08/15/2016 0424   GFRAA 42 (L) 08/15/2016 0424     Recent Labs  08/13/16 1910 08/14/16 0753 08/15/16 0424  NA 140 140  140 139  139  K 2.8* 2.8*  2.8* 3.0*  CL 110 115* 115*  CO2 23 20* 20*  GLUCOSE 133* 117* 108*  BUN 29* 27*  27* 23*  23*  CREATININE 1.97* 1.72*  1.7* 1.77*  1.8*  CALCIUM 10.1 10.3 10.6*  MG 1.9  --   --   PHOS  --   --  2.3*   Liver Function Tests:  Recent Labs  08/13/16 1910 08/14/16 0753 08/15/16 0424  AST 89*  89* 87*  --   ALT 61  61* 58  --   ALKPHOS 198*  198* 177*  --   BILITOT 2.0* 2.3*  --   PROT 5.8* 5.8*  --   ALBUMIN 2.8* 2.8* 2.6*   No results for input(s): LIPASE, AMYLASE in the last 8760 hours. No results for input(s): AMMONIA in the last 8760 hours. CBC:  Recent Labs  08/13/16 1910 08/14/16 0753  WBC 5.1  5.1 4.8  4.8  NEUTROABS 3.2  --   HGB 12.3*  12.3* 11.6*  HCT 34.4*  34* 33.4*  MCV 88.7 87.7  PLT 99*  99* 103*  Lipid No results for input(s): CHOL, HDL, LDLCALC, TRIG in the last 8760 hours.  Cardiac Enzymes: No results for input(s): CKTOTAL, CKMB, CKMBINDEX, TROPONINI in the last 8760 hours. BNP: No results for input(s): BNP in the last 8760 hours. Lab Results  Component Value Date   MICROALBUR 1.5 05/26/2009   Lab Results   Component Value Date   HGBA1C 6.2 07/05/2012   Lab Results  Component Value Date   TSH 1.648 08/13/2016   TSH 1.65 08/13/2016   No results found for: VITAMINB12 No results found for: FOLATE No results found for: IRON, TIBC, FERRITIN  Imaging and Procedures obtained prior to SNF admission: No results found.   Not all labs, radiology exams or other studies done during hospitalization come through on my EPIC note; however they are reviewed by me.    Assessment and Plan  ACUTE CVA/ENCEPHALOPATHY - with hx of multiple prior CVA's  -family has asked for comfort care and as few a meds as possible but does want to see what he can do to improve with rehab SNF - admitted for OT/PT  HYPERCALCEMIA/DEHYDRATION- resolved with IVF and oral drinking SNF - will f/u BMP  RETOPERITONEAL LYMPHADENOPATHY/LYMPHOMA - incidental finding while working up encephalopathy and hypercalcemia; family does not desire oncology consult or any work-up of lymphadenopathy  DEPRESSION SNF - cont paxil -pt becomes very agitated without it  HTN SNF - controlled;coreg 25 mg TID continued  CAD - s/p CABG and 12 stents SNF - stable; cont plavix 75 mg daily and coreg 25 mg TID; statin was one of the meds d/c  BPH SNF - stable;cont flomax o.4 mg daily   Time spent > 45 min;> 50% of time with patient was spent reviewing records, labs, tests and studies, counseling and developing plan of care  Webb Silversmith D. Sheppard Coil, MD

## 2016-08-18 ENCOUNTER — Encounter: Payer: Self-pay | Admitting: Internal Medicine

## 2016-08-18 ENCOUNTER — Non-Acute Institutional Stay (SKILLED_NURSING_FACILITY): Payer: Medicare Other | Admitting: Internal Medicine

## 2016-08-18 DIAGNOSIS — I69320 Aphasia following cerebral infarction: Secondary | ICD-10-CM

## 2016-08-18 DIAGNOSIS — R6889 Other general symptoms and signs: Secondary | ICD-10-CM

## 2016-08-18 NOTE — Progress Notes (Signed)
Location:  [facility] Number: [id number] Place of Service:  SNF (31) Bryan [name]. Sheppard Coil, MD  No care team member to display  Extended Emergency Contact Information Primary Emergency Contact: Cadmus,Brenda R Address: [place], [place] Phone: [phone] Relation: Other    Allergies: Iodinated diagnostic agents and Metrizamide  Chief Complaint  Patient presents with  . Acute Visit    Acute    HPI: Patient is 76 y.o. male who nursing asked me to see for possible acute illness; according to pt's wife. Per her pt is acting differently, he looks like he doesn't feel well. ST is in the room and says she has noted pt has receptive and expressive aphasia which could be confused with confusion.  Past Medical History:  Diagnosis Date  . Abdominal pain, recurrent    also epigastric...also IBS.....  . Abnormal blood chemistry   . Acute sprain or strain of cervical region   . Anxiety state 12/29/2012  . BiPAP (biphasic positive airway pressure) dependence   . BPH (benign prostatic hyperplasia)   . CAD (coronary artery disease)    MI x 1  . Cataract   . Chronic kidney disease    Stage 1  . Compartment syndrome of lower extremity ([place]) 11/27/2013  . Congestive heart failure (CHF) ([place])   . Contusion of left foot   . Coronary atherosclerosis 12/29/2012  . Diabetes mellitus ([place])   . Diverticulitis    PMH of  . Diverticulosis of colon    Dr Fuller Plan  . Erectile dysfunction   . Essential hypertension 12/29/2012  . GERD (gastroesophageal reflux disease)   . GERD (gastroesophageal reflux disease) 12/29/2012  . Gout   . Hemorrhoids   . Hepatitis   . History of colonic polyps   . Hypercholesterolemia   . Hyperlipidemia 04/07/2015  . Hypertension    unspecified  . Kidney stones    Dr Thomasene Mohair  . Low back pain   . Myocardial infarction   . Nephrolithiasis    hx of PMH of x4  . Open wound of knee,  leg, and ankle 11/22/2013  . OSA (obstructive sleep apnea)   . Renal insufficiency    chronic  . Syncope   . Tubular adenoma of colon 07/2005    Past Surgical History:  Procedure Laterality Date  . ANKLE SURGERY    . APPENDECTOMY    . CARDIAC CATHETERIZATION  10/22/2012   Dr Atilano Median; 2  100 % blockages posterior aspect of heart;  x3  . CATARACT EXTRACTION W/ INTRAOCULAR LENS  IMPLANT, BILATERAL Bilateral   . CHOLECYSTECTOMY    . COLON SURGERY  2005   left hemicolectomy for diverticulitis  . colonoscopy with polypectomy     Dr Fuller Plan  . CORONARY ANGIOPLASTY  12/2011   catheter balloon ruptured; with stent placement  . CORONARY ANGIOPLASTY WITH STENT PLACEMENT     total 10 stents  . CORONARY ARTERY BYPASS GRAFT     5 vessels  . ESOPHAGOGASTRODUODENOSCOPY    . GALLBLADDER SURGERY    . HERNIA REPAIR    . INCISIONAL HERNIA REPAIR  2012   Dr Raul Del; with mesh , Laparscopically  . LITHOTRIPSY    . PTCA     multiple times  . renal calculi     x2   . SMALL INTESTINE SURGERY  2006   3 feet resected at ventral hernia surgery  Allergies as of 08/18/2016      Reactions   Iodinated Diagnostic Agents Hives   Metrizamide Hives      Medication List       Accurate as of 08/18/16 11:59 PM. Always use your most recent med list.          acetaminophen 325 MG tablet Commonly known as:  TYLENOL Take 2 tablets (650 mg total) by mouth every 6 (six) hours as needed for mild pain (or Fever >/= 101).   carvedilol 25 MG tablet Commonly known as:  COREG 25 mg 3 (three) times daily.   clopidogrel 75 MG tablet Commonly known as:  PLAVIX Take 75 mg by mouth daily.   PARoxetine 30 MG tablet Commonly known as:  PAXIL TAKE 1 TABLET BY MOUTH TWICE DAILY   ranitidine 150 MG tablet Commonly known as:  ZANTAC Take 1 tablet (150 mg total) by mouth 2 (two) times daily.   tamsulosin 0.4 MG Caps capsule Commonly known as:  FLOMAX Take 0.4 mg by mouth daily.       No orders of the  defined types were placed in this encounter.   Immunization History  Administered Date(s) Administered  . Influenza Split 04/22/2011, 05/08/2012  . Influenza Whole 06/13/2007, 06/11/2008, 05/06/2009, 05/08/2013  . Influenza-Unspecified 04/15/2014, 05/22/2015  . PPD Test 08/16/2016  . Pneumococcal Conjugate-13 09/01/2014  . Pneumococcal Polysaccharide-23 07/09/2008  . Tdap 07/05/2012, 11/22/2013    Social History  Substance Use Topics  . Smoking status: Never Smoker  . Smokeless tobacco: Never Used  . Alcohol use No    Review of Systems  DATA OBTAINED: from patient,  family member GENERAL:  no fevers,+ fatigue, appetite changes SKIN: No itching, rash HEENT: No complaint RESPIRATORY: occ cough,no wheezing, noSOB CARDIAC: No chest pain, palpitations, lower extremity edema  GI: No abdominal pain, No N/V/D or constipation, No heartburn or reflux  GU: No dysuria, frequency or urgency, or incontinence  MUSCULOSKELETAL: No unrelieved bone/joint pain NEUROLOGIC: No headache, dizziness  PSYCHIATRIC: No overt anxiety or sadness  Vitals:   08/18/16 1349  BP: (!) 112/53  Pulse: 68  Resp: 20  Temp: 97.3 F (36.3 C)   Body mass index is 35.01 kg/m. Physical Exam  GENERAL APPEARANCE: Alert, No acute distress but does look like he feels sick, not toxic SKIN: No diaphoresis rash HEENT: Unremarkable RESPIRATORY: Breathing is even, unlabored. Lung sounds are slt rhonchi   CARDIOVASCULAR: Heart RRR no murmurs, rubs or gallops. No peripheral edema  GASTROINTESTINAL: Abdomen is soft, non-tender, not distended w/ normal bowel sounds.  GENITOURINARY: Bladder non tender, not distended  MUSCULOSKELETAL: No abnormal joints or musculature NEUROLOGIC: Cranial nerves 2-12 grossly intact; aphasia; Moves all extremities PSYCHIATRIC: Mood and affect appropriate to situation, no behavioral issues  Patient Active Problem List   Diagnosis Date Noted  . Diabetes mellitus ([place])   . Myocardial  infarction   . Acute CVA (cerebrovascular accident) ([place]) 08/21/2016  . Hypercalcemia 08/21/2016  . Dehydration 08/21/2016  . Lymphadenopathy, retroperitoneal 08/21/2016  . Depression 08/21/2016  . CAD (coronary artery disease) 08/21/2016  . BPH (benign prostatic hyperplasia) 08/21/2016  . Hypokalemia 08/14/2016  . Encephalopathy acute 08/13/2016  . Chest pain with high risk of acute coronary syndrome 08/21/2013  . GERD (gastroesophageal reflux disease) 12/29/2012  . ANXIETY STATE, UNSPECIFIED 11/18/2009  . MYALGIA 11/18/2009  . CHEST PAIN 07/14/2009  . OTHER DYSPHAGIA 07/14/2009  . FLANK PAIN, RIGHT 10/30/2008  . NEPHROLITHIASIS, HX OF 10/30/2008  . HEADACHE 03/26/2008  . HYPERGLYCEMIA  03/26/2008  . LIPOMAS, MULTIPLE 01/14/2008  . HYPERLIPIDEMIA 08/02/2007  . Essential hypertension 08/02/2007  . Coronary atherosclerosis 08/02/2007  . Chronic kidney disease 08/02/2007  . SYNCOPE 08/02/2007  . UNS ADVRS EFF UNS RX MEDICINAL&BIOLOGICAL SBSTNC 07/30/2007  . LOW BACK PAIN SYNDROME 04/02/2007  . ABDOMINAL PAIN, RECURRENT 04/02/2007  . POLYP, COLON 08/30/2006  . DIVERTICULOSIS, COLON 08/30/2006  . IBS 08/30/2006    CMP     Component Value Date/Time   NA 139 08/15/2016 0424   NA 139 08/15/2016 0424   K 3.0 (L) 08/15/2016 0424   CL 115 (H) 08/15/2016 0424   CO2 20 (L) 08/15/2016 0424   GLUCOSE 108 (H) 08/15/2016 0424   BUN 23 (H) 08/15/2016 0424   BUN 23 (A) 08/15/2016 0424   CREATININE 1.77 (H) 08/15/2016 0424   CREATININE 1.8 (A) 08/15/2016 0424   CREATININE 2.17 (H) 10/26/2012 1617   CALCIUM 10.6 (H) 08/15/2016 0424   PROT 5.8 (L) 08/14/2016 0753   ALBUMIN 2.6 (L) 08/15/2016 0424   AST 87 (H) 08/14/2016 0753   ALT 58 08/14/2016 0753   ALKPHOS 177 (H) 08/14/2016 0753   BILITOT 2.3 (H) 08/14/2016 0753   GFRNONAA 36 (L) 08/15/2016 0424   GFRAA 42 (L) 08/15/2016 0424    Recent Labs  08/13/16 1910 08/14/16 0753 08/15/16 0424  NA 140 140  140 139  139  K 2.8*  2.8*  2.8* 3.0*  CL 110 115* 115*  CO2 23 20* 20*  GLUCOSE 133* 117* 108*  BUN 29* 27*  27* 23*  23*  CREATININE 1.97* 1.72*  1.7* 1.77*  1.8*  CALCIUM 10.1 10.3 10.6*  MG 1.9  --   --   PHOS  --   --  2.3*    Recent Labs  08/13/16 0318 08/13/16 1910 08/14/16 0753 08/15/16 0424  AST 81* 89*  89* 87*  --   ALT 55* 61  61* 58  --   ALKPHOS 195* 198*  198* 177*  --   BILITOT  --  2.0* 2.3*  --   PROT  --  5.8* 5.8*  --   ALBUMIN  --  2.8* 2.8* 2.6*    Recent Labs  08/13/16 0318 08/13/16 1910 08/14/16 0753  WBC 5.0 5.1  5.1 4.8  4.8  NEUTROABS  --  3.2  --   HGB 12.1* 12.3*  12.3* 11.6*  HCT 35* 34.4*  34* 33.4*  MCV  --  88.7 87.7  PLT 104* 99*  99* 103*   No results for input(s): CHOL, LDLCALC, TRIG in the last 8760 hours.  Invalid input(s): HCL Lab Results  Component Value Date   MICROALBUR 1.5 05/26/2009   Lab Results  Component Value Date   TSH 1.648 08/13/2016   TSH 1.65 08/13/2016   Lab Results  Component Value Date   HGBA1C 5.0 08/12/2016   Lab Results  Component Value Date   CHOL 126 08/21/2013   HDL 36.30 (L) 08/21/2013   LDLCALC 52 08/21/2013   LDLDIRECT 127.3 05/26/2009   TRIG 187.0 (H) 08/21/2013   CHOLHDL 3 08/21/2013    Significant Diagnostic Results in last 30 days:  No results found.  Assessment and Plan  FEELING SICK -wife has had to be very pro-active with pt in the hospital to get care so she is very sensitized to his condition; I assured her I would do a full work-up- CXR PA and lat, CMP, CBC and u/a  APHASIA 2/2 ACUTE CVA - there was no  where documented in hospital papers but ST has discovered during her extensive eval that pt has both receptive and expressive aphasia; this could acount for what appears to be confusion on his part    Time spent > 35 min Jossilyn Benda D. Sheppard Coil, MD

## 2016-08-19 LAB — BASIC METABOLIC PANEL
BUN: 22 mg/dL — AB (ref 4–21)
Creatinine: 1.5 mg/dL — AB (ref 0.6–1.3)
GLUCOSE: 159 mg/dL
POTASSIUM: 3.2 mmol/L — AB (ref 3.4–5.3)
Sodium: 145 mmol/L (ref 137–147)

## 2016-08-19 LAB — CBC AND DIFFERENTIAL
HCT: 34 % — AB (ref 41–53)
HEMOGLOBIN: 11.1 g/dL — AB (ref 13.5–17.5)
Platelets: 71 10*3/uL — AB (ref 150–399)
WBC: 3.6 10*3/mL

## 2016-08-21 DIAGNOSIS — R590 Localized enlarged lymph nodes: Secondary | ICD-10-CM | POA: Insufficient documentation

## 2016-08-21 DIAGNOSIS — N400 Enlarged prostate without lower urinary tract symptoms: Secondary | ICD-10-CM | POA: Insufficient documentation

## 2016-08-21 DIAGNOSIS — F32A Depression, unspecified: Secondary | ICD-10-CM | POA: Insufficient documentation

## 2016-08-21 DIAGNOSIS — I639 Cerebral infarction, unspecified: Secondary | ICD-10-CM | POA: Insufficient documentation

## 2016-08-21 DIAGNOSIS — F329 Major depressive disorder, single episode, unspecified: Secondary | ICD-10-CM | POA: Insufficient documentation

## 2016-08-21 DIAGNOSIS — E860 Dehydration: Secondary | ICD-10-CM | POA: Insufficient documentation

## 2016-08-21 DIAGNOSIS — I2510 Atherosclerotic heart disease of native coronary artery without angina pectoris: Secondary | ICD-10-CM | POA: Insufficient documentation

## 2016-08-22 LAB — BASIC METABOLIC PANEL
BUN: 22 mg/dL — AB (ref 4–21)
Creatinine: 1.5 mg/dL — AB (ref 0.6–1.3)
Glucose: 144 mg/dL
Potassium: 3.2 mmol/L — AB (ref 3.4–5.3)
SODIUM: 140 mmol/L (ref 137–147)

## 2016-08-24 ENCOUNTER — Encounter: Payer: Self-pay | Admitting: Internal Medicine

## 2016-08-24 ENCOUNTER — Non-Acute Institutional Stay: Payer: Self-pay | Admitting: Internal Medicine

## 2016-08-24 DIAGNOSIS — E119 Type 2 diabetes mellitus without complications: Secondary | ICD-10-CM | POA: Insufficient documentation

## 2016-08-24 DIAGNOSIS — Z20828 Contact with and (suspected) exposure to other viral communicable diseases: Secondary | ICD-10-CM

## 2016-08-24 DIAGNOSIS — I219 Acute myocardial infarction, unspecified: Secondary | ICD-10-CM | POA: Insufficient documentation

## 2016-08-25 ENCOUNTER — Non-Acute Institutional Stay (SKILLED_NURSING_FACILITY): Payer: Medicare Other | Admitting: Internal Medicine

## 2016-08-25 DIAGNOSIS — R062 Wheezing: Secondary | ICD-10-CM | POA: Diagnosis not present

## 2016-08-25 DIAGNOSIS — R6889 Other general symptoms and signs: Secondary | ICD-10-CM

## 2016-08-25 DIAGNOSIS — J9601 Acute respiratory failure with hypoxia: Secondary | ICD-10-CM

## 2016-08-26 LAB — CBC AND DIFFERENTIAL
HCT: 35 % — AB (ref 41–53)
HEMOGLOBIN: 11.8 g/dL — AB (ref 13.5–17.5)
Platelets: 83 10*3/uL — AB (ref 150–399)
WBC: 3.6 10*3/mL

## 2016-08-26 LAB — BASIC METABOLIC PANEL
BUN: 19 mg/dL (ref 4–21)
CREATININE: 1.2 mg/dL (ref 0.6–1.3)
GLUCOSE: 125 mg/dL
POTASSIUM: 3.3 mmol/L — AB (ref 3.4–5.3)
SODIUM: 140 mmol/L (ref 137–147)

## 2016-08-28 ENCOUNTER — Encounter: Payer: Self-pay | Admitting: Internal Medicine

## 2016-08-28 DIAGNOSIS — I69320 Aphasia following cerebral infarction: Secondary | ICD-10-CM | POA: Insufficient documentation

## 2016-08-29 ENCOUNTER — Encounter: Payer: Self-pay | Admitting: Internal Medicine

## 2016-08-29 NOTE — Progress Notes (Addendum)
Location:  [facility] [place] of Service:   SNF  [facility] d Yvonnie Schinke MD  No care team member to display  Extended Emergency Contact Information Primary Emergency Contact: Offutt,Brenda R Address: [place], [place] Phone: [phone] Relation: Other    Allergies: Iodinated diagnostic agents and Metrizamide  Chief Complaint  Patient presents with  . Acute Visit    HPI: Patient is 76 y.o. male who is being seen for flu-like symptoms in a flu outbreak. Pt was noted to be wheezing and coughing. Pt has aphasia from recent stroke so all info is from nursing. No reported fever.  Past Medical History:  Diagnosis Date  . Abdominal pain, recurrent    also epigastric...also IBS.....  . Abnormal blood chemistry   . Acute sprain or strain of cervical region   . Anxiety state 12/29/2012  . BiPAP (biphasic positive airway pressure) dependence   . BPH (benign prostatic hyperplasia)   . CAD (coronary artery disease)    MI x 1  . Cataract   . Chronic kidney disease    Stage 1  . Compartment syndrome of lower extremity ([place]) 11/27/2013  . Congestive heart failure (CHF) ([place])   . Contusion of left foot   . Coronary atherosclerosis 12/29/2012  . Diabetes mellitus ([place])   . Diverticulitis    PMH of  . Diverticulosis of colon    Dr Fuller Plan  . Erectile dysfunction   . Essential hypertension 12/29/2012  . GERD (gastroesophageal reflux disease)   . GERD (gastroesophageal reflux disease) 12/29/2012  . Gout   . Hemorrhoids   . Hepatitis   . History of colonic polyps   . Hypercholesterolemia   . Hyperlipidemia 04/07/2015  . Hypertension    unspecified  . Kidney stones    Dr Thomasene Mohair  . Low back pain   . Myocardial infarction   . Nephrolithiasis    hx of PMH of x4  . Open wound of knee, leg, and ankle 11/22/2013  . OSA (obstructive sleep apnea)   . Renal insufficiency    chronic  . Syncope   . Tubular adenoma  of colon 07/2005    Past Surgical History:  Procedure Laterality Date  . ANKLE SURGERY    . APPENDECTOMY    . CARDIAC CATHETERIZATION  10/22/2012   Dr Atilano Median; 2  100 % blockages posterior aspect of heart;  x3  . CATARACT EXTRACTION W/ INTRAOCULAR LENS  IMPLANT, BILATERAL Bilateral   . CHOLECYSTECTOMY    . COLON SURGERY  2005   left hemicolectomy for diverticulitis  . colonoscopy with polypectomy     Dr Fuller Plan  . CORONARY ANGIOPLASTY  12/2011   catheter balloon ruptured; with stent placement  . CORONARY ANGIOPLASTY WITH STENT PLACEMENT     total 10 stents  . CORONARY ARTERY BYPASS GRAFT     5 vessels  . ESOPHAGOGASTRODUODENOSCOPY    . GALLBLADDER SURGERY    . HERNIA REPAIR    . INCISIONAL HERNIA REPAIR  2012   Dr Raul Del; with mesh , Laparscopically  . LITHOTRIPSY    . PTCA     multiple times  . renal calculi     x2   . SMALL INTESTINE SURGERY  2006   3 feet resected at ventral hernia surgery    Allergies as of 08/25/2016      Reactions   Iodinated Diagnostic Agents Hives   Metrizamide  Hives      Medication List       Accurate as of 08/25/16 11:59 PM. Always use your most recent med list.          acetaminophen 325 MG tablet Commonly known as:  TYLENOL Take 2 tablets (650 mg total) by mouth every 6 (six) hours as needed for mild pain (or Fever >/= 101).   carvedilol 25 MG tablet Commonly known as:  COREG Take 25 mg by mouth daily.   clopidogrel 75 MG tablet Commonly known as:  PLAVIX Take 75 mg by mouth daily.   PARoxetine 30 MG tablet Commonly known as:  PAXIL TAKE 1 TABLET BY MOUTH TWICE DAILY   ranitidine 150 MG tablet Commonly known as:  ZANTAC Take 1 tablet (150 mg total) by mouth 2 (two) times daily.   tamsulosin 0.4 MG Caps capsule Commonly known as:  FLOMAX Take 0.4 mg by mouth daily.       No orders of the defined types were placed in this encounter.   Immunization History  Administered Date(s) Administered  . Influenza Split  04/22/2011, 05/08/2012  . Influenza Whole 06/13/2007, 06/11/2008, 05/06/2009, 05/08/2013  . Influenza-Unspecified 04/15/2014, 05/22/2015  . PPD Test 08/16/2016  . Pneumococcal Conjugate-13 09/01/2014  . Pneumococcal Polysaccharide-23 07/09/2008  . Tdap 07/05/2012, 11/22/2013    Social History  Substance Use Topics  . Smoking status: Never Smoker  . Smokeless tobacco: Never Used  . Alcohol use No    Review of Systems per nursing as per HPI    Vitals:   09/18/16 1336  BP: (!) 112/53  Pulse: 68  Resp: 20  Temp: 97.3 F (36.3 C)   There is no height or weight on file to calculate BMI. Physical Exam  GENERAL APPEARANCE: Alert, non conversant,   SKIN: No diaphoresis rash HEENT: Unremarkable RESPIRATORY: Breathing is even, unlabored. Lung sounds are wheezing; O2 sat 86% RA ; no wet sounds  CARDIOVASCULAR: Heart RRR no murmurs, rubs or gallops. No peripheral edema  GASTROINTESTINAL: Abdomen is soft, non-tender, not distended w/ normal bowel sounds.  GENITOURINARY: Bladder non tender, not distended  MUSCULOSKELETAL: No abnormal joints or musculature NEUROLOGIC: Cranial nerves 2-12 grossly intact.X aphasia ; Moves all extremities PSYCHIATRIC: appears confusion 2/2 aphasia, no behavioral issues  Patient Active Problem List   Diagnosis Date Noted  . 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 Diabetes mellitus ([place])   . Myocardial infarction   . Acute CVA (cerebrovascular accident) ([place]) 08/21/2016  . Hypercalcemia 08/21/2016  . Dehydration 08/21/2016  . Lymphadenopathy, retroperitoneal 08/21/2016  . Depression 08/21/2016  . CAD (coronary artery disease) 08/21/2016  . BPH (benign prostatic hyperplasia) 08/21/2016  . Hypokalemia 08/14/2016  . Encephalopathy acute 08/13/2016  . Chest pain with high risk of acute coronary syndrome 08/21/2013  . GERD (gastroesophageal reflux disease) 12/29/2012  . ANXIETY STATE, UNSPECIFIED 11/18/2009  . MYALGIA 11/18/2009  .  CHEST PAIN 07/14/2009  . OTHER DYSPHAGIA 07/14/2009  . FLANK PAIN, RIGHT 10/30/2008  . NEPHROLITHIASIS, HX OF 10/30/2008  . HEADACHE 03/26/2008  . HYPERGLYCEMIA 03/26/2008  . LIPOMAS, MULTIPLE 01/14/2008  . HYPERLIPIDEMIA 08/02/2007  . Essential hypertension 08/02/2007  . Coronary atherosclerosis 08/02/2007  . Chronic kidney disease 08/02/2007  . SYNCOPE 08/02/2007  . UNS ADVRS EFF UNS RX MEDICINAL&BIOLOGICAL SBSTNC 07/30/2007  . LOW BACK PAIN SYNDROME 04/02/2007  . ABDOMINAL PAIN, RECURRENT 04/02/2007  . POLYP, COLON 08/30/2006  . DIVERTICULOSIS, COLON 08/30/2006  . IBS 08/30/2006    CMP  Component Value Date/Time   NA 140 08/26/2016   K 3.3 (A) 08/26/2016   CL 115 (H) 08/15/2016 0424   CO2 20 (L) 08/15/2016 0424   GLUCOSE 108 (H) 08/15/2016 0424   BUN 19 08/26/2016   CREATININE 1.2 08/26/2016   CREATININE 1.77 (H) 08/15/2016 0424   CREATININE 2.17 (H) 10/26/2012 1617   CALCIUM 10.6 (H) 08/15/2016 0424   PROT 5.8 (L) 08/14/2016 0753   ALBUMIN 2.6 (L) 08/15/2016 0424   AST 87 (H) 08/14/2016 0753   ALT 58 08/14/2016 0753   ALKPHOS 177 (H) 08/14/2016 0753   BILITOT 2.3 (H) 08/14/2016 0753   GFRNONAA 36 (L) 08/15/2016 0424   GFRAA 42 (L) 08/15/2016 0424    Recent Labs  08/13/16 1910 08/14/16 0753 08/15/16 0424 08/19/16 08/22/16 08/26/16  NA 140 140  140 139  139 145 140 140  K 2.8* 2.8*  2.8* 3.0* 3.2* 3.2* 3.3*  CL 110 115* 115*  --   --   --   CO2 23 20* 20*  --   --   --   GLUCOSE 133* 117* 108*  --   --   --   BUN 29* 27*  27* 23*  23* 22* 22* 19  CREATININE 1.97* 1.72*  1.7* 1.77*  1.8* 1.5* 1.5* 1.2  CALCIUM 10.1 10.3 10.6*  --   --   --   MG 1.9  --   --   --   --   --   PHOS  --   --  2.3*  --   --   --     Recent Labs  08/13/16 0318 08/13/16 1910 08/14/16 0753 08/15/16 0424  AST 81* 89*  89* 87*  --   ALT 55* 61  61* 58  --   ALKPHOS 195* 198*  198* 177*  --   BILITOT  --  2.0* 2.3*  --   PROT  --  5.8* 5.8*  --   ALBUMIN  --   2.8* 2.8* 2.6*    Recent Labs  08/13/16 1910 08/14/16 0753 08/19/16 08/26/16  WBC 5.1  5.1 4.8  4.8 3.6 3.6  NEUTROABS 3.2  --   --   --   HGB 12.3*  12.3* 11.6* 11.1* 11.8*  HCT 34.4*  34* 33.4* 34* 35*  MCV 88.7 87.7  --   --   PLT 99*  99* 103* 71* 83*   No results for input(s): CHOL, LDLCALC, TRIG in the last 8760 hours.  Invalid input(s): HCL Lab Results  Component Value Date   MICROALBUR 1.5 05/26/2009   Lab Results  Component Value Date   TSH 1.648 08/13/2016   TSH 1.65 08/13/2016   Lab Results  Component Value Date   HGBA1C 5.0 08/12/2016   Lab Results  Component Value Date   CHOL 126 08/21/2013   HDL 36.30 (L) 08/21/2013   LDLCALC 52 08/21/2013   LDLDIRECT 127.3 05/26/2009   TRIG 187.0 (H) 08/21/2013   CHOLHDL 3 08/21/2013    Significant Diagnostic Results in last 30 days:  No results found.  Assessment and Plan  FLULIKE SYMPTOMS/ WHEEZING/ACUTE RESP FAILURE WITH HYPOXIA - O2 started to keep O2 sat > 92, scheduled duonebs TID for 7 days, mucinex 600 mg BID for 7 days; because eof flu outbreak need to trat as if so tamiflu 30 mg BID for 5 days ordered; Have ordered CXR, influenza test, CBC, BMP; will monitor   Inocencio Homes, MD

## 2016-09-05 ENCOUNTER — Encounter: Payer: Self-pay | Admitting: Internal Medicine

## 2016-09-05 ENCOUNTER — Non-Acute Institutional Stay (SKILLED_NURSING_FACILITY): Payer: Medicare Other | Admitting: Internal Medicine

## 2016-09-05 DIAGNOSIS — I10 Essential (primary) hypertension: Secondary | ICD-10-CM

## 2016-09-05 DIAGNOSIS — G9340 Encephalopathy, unspecified: Secondary | ICD-10-CM | POA: Diagnosis not present

## 2016-09-05 DIAGNOSIS — I639 Cerebral infarction, unspecified: Secondary | ICD-10-CM

## 2016-09-05 DIAGNOSIS — E860 Dehydration: Secondary | ICD-10-CM

## 2016-09-05 DIAGNOSIS — F329 Major depressive disorder, single episode, unspecified: Secondary | ICD-10-CM

## 2016-09-05 DIAGNOSIS — I25810 Atherosclerosis of coronary artery bypass graft(s) without angina pectoris: Secondary | ICD-10-CM

## 2016-09-05 DIAGNOSIS — N400 Enlarged prostate without lower urinary tract symptoms: Secondary | ICD-10-CM

## 2016-09-05 DIAGNOSIS — R590 Localized enlarged lymph nodes: Secondary | ICD-10-CM | POA: Diagnosis not present

## 2016-09-05 DIAGNOSIS — F32A Depression, unspecified: Secondary | ICD-10-CM

## 2016-09-05 NOTE — Progress Notes (Signed)
Location:  [place] Room Number: [id number] Place of Service:  SNF (31) Bryan [name]. Bryan Coil, MD  PCP: No primary care provider on file. No care team member to display  Extended Emergency Contact Information Primary Emergency Contact: Spillers,Brenda R Address: [place], [place] Phone: [phone] Relation: Other  Allergies  Allergen Reactions  . Iodinated Diagnostic Agents Hives  . Metrizamide Hives    Chief Complaint  Patient presents with  . Discharge Note    Discharged from SNF    HPI:  76 y.o. 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 from 1/20-23 as a transfer from [facility]. He was admitted to the outside hospital last night with the working diagnosis of encephalopathy complicated by hypercalcemia and dehydration. Apparently for last 3 days patient has been confused and disoriented, this has been associated with generalized weakness, he had to use a walker for ambulation. Overall generalized malaise and decreased appetite. He was seen by his primary care physician who recommended hospitalization to rule out CVA. As a part of the workup he had aN MRI of the head showing small but and acute CVA  And a CT of the abdomen and pelvis showing multiple retroperitoneal lymphadenopathy.  Both patient's wife and daughter are able to endorse that the patient would not want invasive workup, would not want aggressive measures, over all, they wish to provide him with a rehab attempt, attempt to improve his strength, function, eating. If he is not able to improve, then they are clear on their goals for seeking hospice type care and focusing exclusively on comfort. Pt is admitted to SNF for OT/PT.Pt is now ready to be d/c to home.    Past Medical History:  Diagnosis Date  . Abdominal pain, recurrent    also epigastric...also IBS.....  . Abnormal blood chemistry   . Acute sprain or  strain of cervical region   . Anxiety state 12/29/2012  . BiPAP (biphasic positive airway pressure) dependence   . BPH (benign prostatic hyperplasia)   . CAD (coronary artery disease)    MI x 1  . Cataract   . Chronic kidney disease    Stage 1  . Compartment syndrome of lower extremity ([place]) 11/27/2013  . Congestive heart failure (CHF) ([place])   . Contusion of left foot   . Coronary atherosclerosis 12/29/2012  . Diabetes mellitus ([place])   . Diverticulitis    PMH of  . Diverticulosis of colon    Dr Fuller Plan  . Erectile dysfunction   . Essential hypertension 12/29/2012  . GERD (gastroesophageal reflux disease)   . GERD (gastroesophageal reflux disease) 12/29/2012  . Gout   . Hemorrhoids   . Hepatitis   . History of colonic polyps   . Hypercholesterolemia   . Hyperlipidemia 04/07/2015  . Hypertension    unspecified  . Kidney stones    Dr Thomasene Mohair  . Low back pain   . Myocardial infarction   . Nephrolithiasis    hx of PMH of x4  . Open wound of knee, leg, and ankle 11/22/2013  . OSA (obstructive sleep apnea)   . Renal insufficiency    chronic  . Syncope   . Tubular adenoma of colon 07/2005    Past Surgical History:  Procedure Laterality Date  . ANKLE SURGERY    . APPENDECTOMY    . CARDIAC CATHETERIZATION  10/22/2012   Dr Atilano Median; 2  100 % blockages posterior aspect of heart;  x3  . CATARACT EXTRACTION W/ INTRAOCULAR LENS  IMPLANT, BILATERAL Bilateral   . CHOLECYSTECTOMY    . COLON SURGERY  2005   left hemicolectomy for diverticulitis  . colonoscopy with polypectomy     Dr Fuller Plan  . CORONARY ANGIOPLASTY  12/2011   catheter balloon ruptured; with stent placement  . CORONARY ANGIOPLASTY WITH STENT PLACEMENT     total 10 stents  . CORONARY ARTERY BYPASS GRAFT     5 vessels  . ESOPHAGOGASTRODUODENOSCOPY    . GALLBLADDER SURGERY    . HERNIA REPAIR    . INCISIONAL HERNIA REPAIR  2012   Dr Raul Del; with mesh , Laparscopically  . LITHOTRIPSY    . PTCA     multiple times    . renal calculi     x2   . SMALL INTESTINE SURGERY  2006   3 feet resected at ventral hernia surgery     reports that he has never smoked. He has never used smokeless tobacco. He reports that he does not drink alcohol or use drugs. Social History   Social History  . Marital status: Married    Spouse name: N/A  . Number of children: 2  . Years of education: N/A   Occupational History  . Retired    Social History Main Topics  . Smoking status: Never Smoker  . Smokeless tobacco: Never Used  . Alcohol use No  . Drug use: No  . Sexual activity: Not on file   Other Topics Concern  . Not on file   Social History Narrative   Daily caffeine     Pertinent  Health Maintenance Due  Topic Date Due  . FOOT EXAM  06/24/1951  . URINE MICROALBUMIN  05/26/2010  . OPHTHALMOLOGY EXAM  10/17/2014  . COLONOSCOPY  01/18/2015  . INFLUENZA VACCINE  02/23/2016  . HEMOGLOBIN A1C  02/09/2017  . PNA vac Low Risk Adult  Completed    Medications: Allergies as of 09/05/2016      Reactions   Iodinated Diagnostic Agents Hives   Metrizamide Hives      Medication List       Accurate as of 09/05/16 12:05 PM. Always use your most recent med list.          acetaminophen 325 MG tablet Commonly known as:  TYLENOL Take 2 tablets (650 mg total) by mouth every 6 (six) hours as needed for mild pain (or Fever >/= 101).   carvedilol 25 MG tablet Commonly known as:  COREG Take 25 mg by mouth daily.   clonazePAM 0.5 MG tablet Commonly known as:  KLONOPIN Take 0.5 mg by mouth at bedtime.   clopidogrel 75 MG tablet Commonly known as:  PLAVIX Take 75 mg by mouth daily.   oseltamivir 30 MG capsule Commonly known as:  TAMIFLU Take 30 mg by mouth daily.   PARoxetine 30 MG tablet Commonly known as:  PAXIL TAKE 1 TABLET BY MOUTH TWICE DAILY   potassium chloride SA 20 MEQ tablet Commonly known as:  K-DUR,KLOR-CON Take 20 mEq by mouth daily.   ranitidine 150 MG tablet Commonly known as:   ZANTAC Take 1 tablet (150 mg total) by mouth 2 (two) times daily.   tamsulosin 0.4 MG Caps capsule Commonly known as:  FLOMAX Take 0.4 mg by mouth daily.        Vitals:   09/05/16 0922  BP: 138/74  Pulse: 86  Resp: 20  Temp: 99 F (37.2 C)  SpO2: 98%  Weight: 225 lb 12.8 oz (102.4 kg)  Height: 5\' 11"  (1.803 m)   Body mass index is 31.49 kg/m.  Physical Exam  GENERAL APPEARANCE: Alert, No acute distress.  HEENT: Unremarkable. RESPIRATORY: Breathing is even, unlabored. Lung sounds are clear   CARDIOVASCULAR: Heart RRR no murmurs, rubs or gallops. No peripheral edema.  GASTROINTESTINAL: Abdomen is soft, non-tender, not distended w/ normal bowel sounds.  NEUROLOGIC: Cranial nerves 2-12 grossly intact with dysarthria; Moves all extremities   Labs reviewed: Basic Metabolic Panel:  Recent Labs  08/13/16 1910 08/14/16 0753 08/15/16 0424 08/19/16 08/22/16  NA 140 140  140 139  139 145 140  K 2.8* 2.8*  2.8* 3.0* 3.2* 3.2*  CL 110 115* 115*  --   --   CO2 23 20* 20*  --   --   GLUCOSE 133* 117* 108*  --   --   BUN 29* 27*  27* 23*  23* 22* 22*  CREATININE 1.97* 1.72*  1.7* 1.77*  1.8* 1.5* 1.5*  CALCIUM 10.1 10.3 10.6*  --   --   MG 1.9  --   --   --   --   PHOS  --   --  2.3*  --   --    Lab Results  Component Value Date   MICROALBUR 1.5 05/26/2009   Liver Function Tests:  Recent Labs  08/13/16 0318 08/13/16 1910 08/14/16 0753 08/15/16 0424  AST 81* 89*  89* 87*  --   ALT 55* 61  61* 58  --   ALKPHOS 195* 198*  198* 177*  --   BILITOT  --  2.0* 2.3*  --   PROT  --  5.8* 5.8*  --   ALBUMIN  --  2.8* 2.8* 2.6*   No results for input(s): LIPASE, AMYLASE in the last 8760 hours. No results for input(s): AMMONIA in the last 8760 hours. CBC:  Recent Labs  08/13/16 1910 08/14/16 0753 08/19/16  WBC 5.1  5.1 4.8  4.8 3.6  NEUTROABS 3.2  --   --   HGB 12.3*  12.3* 11.6* 11.1*  HCT 34.4*  34* 33.4* 34*  MCV 88.7 87.7  --   PLT 99*  99*  103* 71*   Lipid No results for input(s): CHOL, HDL, LDLCALC, TRIG in the last 8760 hours. Cardiac Enzymes: No results for input(s): CKTOTAL, CKMB, CKMBINDEX, TROPONINI in the last 8760 hours. BNP: No results for input(s): BNP in the last 8760 hours. CBG: No results for input(s): GLUCAP in the last 8760 hours.  Procedures and Imaging Studies During Stay: No results found.  Assessment/Plan:   Acute CVA (cerebrovascular accident) ([place])  Encephalopathy acute  Dehydration  Hypercalcemia  Lymphadenopathy, retroperitoneal  Essential hypertension  Depression, unspecified depression type  Coronary artery disease involving coronary bypass graft of native heart without angina pectoris  Benign prostatic hyperplasia without lower urinary tract symptoms   Patient is being discharged with the following home health services: OT/PT/Nursing   Patient is being discharged with the following durable medical equipment:  Standard WC  Patient has been advised to f/u with their PCP in 1-2 weeks to bring them up to date on their rehab stay.  Social services at facility was responsible for arranging this appointment.  Pt was provided with a 30 day supply of prescriptions for medications and refills must be obtained from their PCP.  For controlled substances, a more limited supply may  be provided adequate until PCP appointment only.   Time spent > 30 min;> 50% of time with patient was spent reviewing records, labs, tests and studies, counseling and developing plan of care  Bryan [name]. Bryan Coil, MD

## 2016-09-22 DEATH — deceased

## 2016-10-04 ENCOUNTER — Encounter: Payer: Self-pay | Admitting: Internal Medicine

## 2016-10-04 NOTE — Progress Notes (Addendum)
Location:  [place] Room Number: [id number] Place of Service:  SNF (31)  Noah Delaine. Sheppard Coil, MD  No care team member to display  Extended Emergency Contact Information Primary Emergency Contact: Radman,Brenda R Address: [place], [place] of [place] Phone: [phone] Relation: Other    Allergies: Iodinated diagnostic agents and Metrizamide  Chief Complaint  Patient presents with  . Acute Visit    HPI: Patient is 76 y.o. male who is being seen acutely because an outbreak of Influenza A per CDC guidelines was recognized on 08/24/2016. Pt has no c/o flu like symptoms;therefore pt will need to be prophylaxed with Tamiflu for a minimum of 14 days per CDC protocol.  Past Medical History:  Diagnosis Date  . Abdominal pain, recurrent    also epigastric...also IBS.....  . Abnormal blood chemistry   . Acute sprain or strain of cervical region   . Anxiety state 12/29/2012  . BiPAP (biphasic positive airway pressure) dependence   . BPH (benign prostatic hyperplasia)   . CAD (coronary artery disease)    MI x 1  . Cataract   . Chronic kidney disease    Stage 1  . Compartment syndrome of lower extremity ([place]) 11/27/2013  . Congestive heart failure (CHF) ([place])   . Contusion of left foot   . Coronary atherosclerosis 12/29/2012  . Diabetes mellitus ([place])   . Diverticulitis    PMH of  . Diverticulosis of colon    Dr Fuller Plan  . Erectile dysfunction   . Essential hypertension 12/29/2012  . GERD (gastroesophageal reflux disease)   . GERD (gastroesophageal reflux disease) 12/29/2012  . Gout   . Hemorrhoids   . Hepatitis   . History of colonic polyps   . Hypercholesterolemia   . Hyperlipidemia 04/07/2015  . Hypertension    unspecified  . Kidney stones    Dr Thomasene Mohair  . Low back pain   . Myocardial infarction   . Nephrolithiasis    hx of PMH of x4  . Open wound of knee, leg, and ankle 11/22/2013  . OSA  (obstructive sleep apnea)   . Renal insufficiency    chronic  . Syncope   . Tubular adenoma of colon 07/2005    Past Surgical History:  Procedure Laterality Date  . ANKLE SURGERY    . APPENDECTOMY    . CARDIAC CATHETERIZATION  10/22/2012   Dr Atilano Median; 2  100 % blockages posterior aspect of heart;  x3  . CATARACT EXTRACTION W/ INTRAOCULAR LENS  IMPLANT, BILATERAL Bilateral   . CHOLECYSTECTOMY    . COLON SURGERY  2005   left hemicolectomy for diverticulitis  . colonoscopy with polypectomy     Dr Fuller Plan  . CORONARY ANGIOPLASTY  12/2011   catheter balloon ruptured; with stent placement  . CORONARY ANGIOPLASTY WITH STENT PLACEMENT     total 10 stents  . CORONARY ARTERY BYPASS GRAFT     5 vessels  . ESOPHAGOGASTRODUODENOSCOPY    . GALLBLADDER SURGERY    . HERNIA REPAIR    . INCISIONAL HERNIA REPAIR  2012   Dr Raul Del; with mesh , Laparscopically  . LITHOTRIPSY    . PTCA     multiple times  . renal calculi     x2   . SMALL INTESTINE SURGERY  2006   3 feet resected at ventral hernia surgery    Allergies as of 08/24/2016  Reactions   Iodinated Diagnostic Agents Hives   Metrizamide Hives      Medication List       Accurate as of 08/24/16 11:59 PM. Always use your most recent med list.          acetaminophen 325 MG tablet Commonly known as:  TYLENOL Take 2 tablets (650 mg total) by mouth every 6 (six) hours as needed for mild pain (or Fever >/= 101).   carvedilol 25 MG tablet Commonly known as:  COREG Take 25 mg by mouth daily.   clonazePAM 0.5 MG tablet Commonly known as:  KLONOPIN Take 0.5 mg by mouth at bedtime.   clopidogrel 75 MG tablet Commonly known as:  PLAVIX Take 75 mg by mouth daily.   PARoxetine 30 MG tablet Commonly known as:  PAXIL TAKE 1 TABLET BY MOUTH TWICE DAILY   potassium chloride SA 20 MEQ tablet Commonly known as:  K-DUR,KLOR-CON Take 20 mEq by mouth daily.   ranitidine 150 MG tablet Commonly known as:  ZANTAC Take 1 tablet (150  mg total) by mouth 2 (two) times daily.   tamsulosin 0.4 MG Caps capsule Commonly known as:  FLOMAX Take 0.4 mg by mouth daily.       No orders of the defined types were placed in this encounter.   Immunization History  Administered Date(s) Administered  . Influenza Split 04/22/2011, 05/08/2012  . Influenza Whole 06/13/2007, 06/11/2008, 05/06/2009, 05/08/2013  . Influenza-Unspecified 04/15/2014, 05/22/2015  . PPD Test 08/16/2016  . Pneumococcal Conjugate-13 09/01/2014  . Pneumococcal Polysaccharide-23 07/09/2008  . Tdap 07/05/2012, 11/22/2013    Social History  Substance Use Topics  . Smoking status: Never Smoker  . Smokeless tobacco: Never Used  . Alcohol use No    Review of Systems  DATA OBTAINED: from patient, nurse GENERAL:  no fevers SKIN: No itching, rash HEENT: no rhinorrhea, congestion, ST or ear pain RESPIRATORY: No cough, wheezing, SOB CARDIAC: No chest pain, palpitations, lower extremity edema  GI: No abdominal pain, No N/V/D or constipation, No heartburn or reflux  MUSCULOSKELETAL: No muscle aches NEUROLOGIC: No headache, dizziness   Vitals:   08/24/16 1527  BP: (!) 112/53  Pulse: 68  Resp: 20  Temp: 97.3 F (36.3 C)   Body mass index is 35.01 kg/m. Physical Exam  GENERAL APPEARANCE: Alert,  No acute distress  SKIN: No diaphoresis rash HEENT: Unremarkable RESPIRATORY: Breathing is even, unlabored. Lung sounds are clear   CARDIOVASCULAR: Heart RRR no murmurs, rubs or gallops. No peripheral edema  GASTROINTESTINAL: Abdomen is soft, non-tender, not distended w/ normal bowel sounds.   NEUROLOGIC: Cranial nerves 2-12 grossly intact PSYCHIATRIC: baseline, no mental status changes  Patient Active Problem List   Diagnosis Date Noted  . 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 Diabetes mellitus ([place])   . Myocardial infarction   . Acute CVA (cerebrovascular accident) ([place]) 08/21/2016  . Hypercalcemia 08/21/2016  . Dehydration  08/21/2016  . Lymphadenopathy, retroperitoneal 08/21/2016  . Depression 08/21/2016  . CAD (coronary artery disease) 08/21/2016  . BPH (benign prostatic hyperplasia) 08/21/2016  . Hypokalemia 08/14/2016  . Encephalopathy acute 08/13/2016  . Chest pain with high risk of acute coronary syndrome 08/21/2013  . GERD (gastroesophageal reflux disease) 12/29/2012  . ANXIETY STATE, UNSPECIFIED 11/18/2009  . MYALGIA 11/18/2009  . CHEST PAIN 07/14/2009  . OTHER DYSPHAGIA 07/14/2009  . FLANK PAIN, RIGHT 10/30/2008  . NEPHROLITHIASIS, HX OF 10/30/2008  . HEADACHE 03/26/2008  . HYPERGLYCEMIA 03/26/2008  . LIPOMAS, MULTIPLE 01/14/2008  .  HYPERLIPIDEMIA 08/02/2007  . Essential hypertension 08/02/2007  . Coronary atherosclerosis 08/02/2007  . Chronic kidney disease 08/02/2007  . SYNCOPE 08/02/2007  . UNS ADVRS EFF UNS RX MEDICINAL&BIOLOGICAL SBSTNC 07/30/2007  . LOW BACK PAIN SYNDROME 04/02/2007  . ABDOMINAL PAIN, RECURRENT 04/02/2007  . POLYP, COLON 08/30/2006  . DIVERTICULOSIS, COLON 08/30/2006  . IBS 08/30/2006    CMP     Component Value Date/Time   NA 140 08/26/2016   K 3.3 (A) 08/26/2016   CL 115 (H) 08/15/2016 0424   CO2 20 (L) 08/15/2016 0424   GLUCOSE 108 (H) 08/15/2016 0424   BUN 19 08/26/2016   CREATININE 1.2 08/26/2016   CREATININE 1.77 (H) 08/15/2016 0424   CREATININE 2.17 (H) 10/26/2012 1617   CALCIUM 10.6 (H) 08/15/2016 0424   PROT 5.8 (L) 08/14/2016 0753   ALBUMIN 2.6 (L) 08/15/2016 0424   AST 87 (H) 08/14/2016 0753   ALT 58 08/14/2016 0753   ALKPHOS 177 (H) 08/14/2016 0753   BILITOT 2.3 (H) 08/14/2016 0753   GFRNONAA 36 (L) 08/15/2016 0424   GFRAA 42 (L) 08/15/2016 0424    Recent Labs  08/13/16 1910 08/14/16 0753 08/15/16 0424 08/19/16 08/22/16 08/26/16  NA 140 140  140 139  139 145 140 140  K 2.8* 2.8*  2.8* 3.0* 3.2* 3.2* 3.3*  CL 110 115* 115*  --   --   --   CO2 23 20* 20*  --   --   --   GLUCOSE 133* 117* 108*  --   --   --   BUN 29* 27*  27* 23*   23* 22* 22* 19  CREATININE 1.97* 1.72*  1.7* 1.77*  1.8* 1.5* 1.5* 1.2  CALCIUM 10.1 10.3 10.6*  --   --   --   MG 1.9  --   --   --   --   --   PHOS  --   --  2.3*  --   --   --     Recent Labs  08/13/16 0318 08/13/16 1910 08/14/16 0753 08/15/16 0424  AST 81* 89*  89* 87*  --   ALT 55* 61  61* 58  --   ALKPHOS 195* 198*  198* 177*  --   BILITOT  --  2.0* 2.3*  --   PROT  --  5.8* 5.8*  --   ALBUMIN  --  2.8* 2.8* 2.6*    Recent Labs  08/13/16 1910 08/14/16 0753 08/19/16 08/26/16  WBC 5.1  5.1 4.8  4.8 3.6 3.6  NEUTROABS 3.2  --   --   --   HGB 12.3*  12.3* 11.6* 11.1* 11.8*  HCT 34.4*  34* 33.4* 34* 35*  MCV 88.7 87.7  --   --   PLT 99*  99* 103* 71* 83*   No results for input(s): CHOL, LDLCALC, TRIG in the last 8760 hours.  Invalid input(s): HCL Lab Results  Component Value Date   MICROALBUR 1.5 05/26/2009   Lab Results  Component Value Date   TSH 1.648 08/13/2016   TSH 1.65 08/13/2016   Lab Results  Component Value Date   HGBA1C 5.0 08/12/2016   Lab Results  Component Value Date   CHOL 126 08/21/2013   HDL 36.30 (L) 08/21/2013   LDLCALC 52 08/21/2013   LDLDIRECT 127.3 05/26/2009   TRIG 187.0 (H) 08/21/2013   CHOLHDL 3 08/21/2013    Significant Diagnostic Results in last 30 days:  No results found.  Assessment and Plan  EXPOSURE TO FLU/ INFLUENZA OUTBREAK AT SNF-   CrCl calculated by me-  38.8      Dose for 14 days-  30 mg daily Pt will be monitored daily for flu like symptoms                                                                                 Webb Silversmith D. Sheppard Coil, MD      This encounter was created in error - please disregard.

## 2016-10-15 ENCOUNTER — Encounter: Payer: Self-pay | Admitting: Internal Medicine
